# Patient Record
Sex: Male | Born: 1985 | Race: Black or African American | Hispanic: No | Marital: Single | Smoking: Current some day smoker
Health system: Southern US, Community
[De-identification: ages and names within clinical notes are randomized; demographics above are authoritative.]

## PROBLEM LIST (undated history)

## (undated) DIAGNOSIS — I10 Essential (primary) hypertension: Secondary | ICD-10-CM

## (undated) DIAGNOSIS — J45909 Unspecified asthma, uncomplicated: Secondary | ICD-10-CM

## (undated) HISTORY — DX: Essential (primary) hypertension: I10

---

## 2010-02-01 ENCOUNTER — Emergency Department (HOSPITAL_COMMUNITY): Admission: EM | Admit: 2010-02-01 | Discharge: 2010-02-01 | Payer: Self-pay | Admitting: Family Medicine

## 2013-06-26 ENCOUNTER — Encounter (HOSPITAL_COMMUNITY): Payer: Self-pay | Admitting: Emergency Medicine

## 2013-06-26 ENCOUNTER — Emergency Department (INDEPENDENT_AMBULATORY_CARE_PROVIDER_SITE_OTHER)
Admission: EM | Admit: 2013-06-26 | Discharge: 2013-06-26 | Disposition: A | Discharge status: Home / Self Care | Payer: 59 | Source: Home / Self Care

## 2013-06-26 DIAGNOSIS — M779 Enthesopathy, unspecified: Principal | ICD-10-CM

## 2013-06-26 DIAGNOSIS — L723 Sebaceous cyst: Secondary | ICD-10-CM

## 2013-06-26 DIAGNOSIS — L720 Epidermal cyst: Secondary | ICD-10-CM

## 2013-06-26 DIAGNOSIS — M65839 Other synovitis and tenosynovitis, unspecified forearm: Secondary | ICD-10-CM

## 2013-06-26 DIAGNOSIS — M65849 Other synovitis and tenosynovitis, unspecified hand: Secondary | ICD-10-CM

## 2013-06-26 DIAGNOSIS — M778 Other enthesopathies, not elsewhere classified: Secondary | ICD-10-CM

## 2013-06-26 MED ORDER — DICLOFENAC POTASSIUM 50 MG PO TABS: 50.0000 mg | ORAL_TABLET | Freq: Three times a day (TID) | ORAL | Status: DC

## 2013-06-26 NOTE — ED Notes (Signed)
Reported injury in left arm in past, seen elsewhere . States he hit his hand on something?? The other day , and since then had pain and decreased ROM on his left index finger, ?? Deformity dorsum of hand

## 2013-06-26 NOTE — ED Provider Notes (Signed)
Medical screening examination/treatment/procedure(s) were performed by resident physician or non-physician practitioner and as supervising physician I was immediately available for consultation/collaboration.   KINDL,JAMES DOUGLAS MD.   James D Kindl, MD 06/26/13 1606 

## 2013-06-26 NOTE — ED Provider Notes (Signed)
CSN: [id number]     Arrival date & time 06/26/13  1131 History   First MD Initiated Contact with Patient 06/26/13 1340     Chief Complaint  Patient presents with  . 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 The weakness involves extension of the digits. He noticed it approximately 2 days ago. There is mild swelling or puffiness to the dorsum of the hand along the junction of the 2 tendons is a approached the wrist.  The patient is also concerned about a "knot" located on the right scrotal wall. He noticed it just a few days ago. It is nontender and is not bothering him. There is been no drainage, bleeding or swelling.   History reviewed. No pertinent past medical history. History reviewed. No pertinent past surgical history. History reviewed. No pertinent family history. History  Substance Use Topics  . Smoking status: Never Smoker   . Smokeless tobacco: Not on file  . Alcohol Use: No    Review of Systems  Constitutional: Negative.   Respiratory: Negative.   Gastrointestinal: Negative.   Genitourinary: Negative.   Musculoskeletal: Negative for joint swelling.       As per HPI No known trauma  Skin:       Nodule/cyst as above.  Neurological: Negative for dizziness and weakness.    Allergies  Review of patient's allergies indicates no known allergies.  Home Medications   Current Outpatient Rx  Name  Route  Sig  Dispense  Refill  . diclofenac (CATAFLAM) 50 MG tablet   Oral   Take 1 tablet (50 mg total) by mouth 3 (three) times daily.   21 tablet   0    BP 120/78  Pulse 74  Temp(Src) 98.2 F (36.8 C) (Oral)  Resp 14  SpO2 100% Physical Exam  Nursing note and vitals reviewed. Constitutional: He is oriented to person, place, and time. He  appears well-developed and well-nourished.  HENT:  Head: Normocephalic and atraumatic.  Eyes: EOM are normal. Left eye exhibits no discharge.  Neck: Normal range of motion. Neck supple.  Genitourinary:  There is an subdermal firm spherical mass approx 4 x 4 mm in the R scrotum. Non tender, mobile, no erythema, No swelling . No testicle pain, tenderness or nodules. NEMG.  Musculoskeletal:  There is weakness with extension of the left index and long finger. He is able to extend his digits. Flexion is strong and intact on all digits. Tenderness and discomfort extends from the MCP joints to the dorsal wrist. There is also pain in the dorsum of the wrist and distal forearm with wrist extension against resistance. There is no deformity. No bony tenderness. Distal neurovascular and sensory is intact. Normal color and warmth.  Neurological: He is alert and oriented to person, place, and time. No cranial nerve deficit.  Skin: Skin is warm and dry.  Psychiatric: He has a normal mood and affect.    ED Course  Procedures (including critical care time) Labs Review Labs Reviewed - No data to display Imaging Review No results found.   MDM   1. Tendinitis of finger   2. Epidermal cyst    Wear finger splint at al time while at work for the next couple of weeks.  Ice locally. Reassurance, No tx necessary for epidermal cyst of scrotal wall.  Hayden Rasmussen, NP 06/26/13 1359  Hayden Rasmussen, NP 06/26/13 1401

## 2013-06-26 NOTE — Discharge Instructions (Signed)
Epidermal Cyst An epidermal cyst is usually a small, painless lump under the skin. Cysts often occur on the face, neck, stomach, chest, or genitals. The cyst may be filled with a bad smelling paste. Do not pop your cyst. Popping the cyst can cause pain and puffiness (swelling). HOME CARE   Only take medicines as told by your doctor.  Take your medicine (antibiotics) as told. Finish it even if you start to feel better. GET HELP RIGHT AWAY IF:  Your cyst is tender, red, or puffy.  You are not getting better, or you are getting worse.  You have any questions or concerns. MAKE SURE YOU:  Understand these instructions.  Will watch your condition.  Will get help right away if you are not doing well or get worse. Document Released: 04/19/2004 Document Revised: 09/11/2011 Document Reviewed: 09/18/2010 [facility]ExitCare Patient Information 2014 [place], [place].  Repetitive Strain Injuries Repetitive strain injuries (RSIs) result from overuse or misuse of soft tissues including muscles, tendons, or nerves. Tendons are the cord-like structures that attach muscles to bones. RSIs can affect almost any part of the body. However, RSIs are most common in the arms (thumbs, wrists, elbows, shoulders) and legs (ankles, knees). Common medical conditions that are often caused by repetitive strain include carpal tunnel syndrome, tennis or golfer's elbow, bursitis, and tendonitis. If RSIs are treated early, and therepeated activity is reduced or removed, the severity and length of your problems can usually be reduced. RSIs are also called cumulative trauma disorders (CTD).  CAUSES  Many RSIs occur due to repeating the same activity at work over weeks or months without sufficient rest, such as prolonged typing. RSIs also commonly occur when a hobby or sport is done repeatedly without sufficient rest. RSIs can also occur due to repeated strain or stress on a body part in someone who has one or more risk factors for  RSIs. RISK FACTORS Workplace risk factors  Frequent computer use, especially if your workstation is not adjusted for your body type.  Infrequent rest breaks.  Working in a high-pressure environment.  Working at a [employer]fast pace.  Repeating the same motion, such as frequent typing.  Working in an awkward position or holding the same position for a long time.  Forceful movements such as lifting, pulling, or pushing.  Vibration caused by using power tools.  Working in cold temperatures.  Job stress. Personal risk factors  Poor posture.  Being loose-jointed.  Not exercising regularly.  Being overweight.  Arthritis, diabetes, thyroid problems, or other long-term (chronic)medical conditions.  Vitamin deficiencies.  Keeping your fingernails long.  An unhealthy, stressful, or inactive lifestyle.  Not sleeping well. SYMPTOMS  Symptoms often begin at work but become more noticeable after the repeated stress has ended. For example, you may develop fatigue or soreness in your wrist while typingat work, and at night you may develop numbness and tingling in your fingers. Common symptoms include:   Burning, shooting, or aching pain, especially in the fingers, palms, wrists, forearms, or shoulders.  Tenderness.  Swelling.  Tingling, numbness, or loss of feeling.  Pain with certain activities, such as turning a doorknob or reaching above your head.  Weakness, heaviness, or loss of coordination in yourhand.  Muscle spasms or tightness. In some cases, symptoms can become so intense that it is difficult to perform everyday tasks. Symptoms that do not improve with rest may indicate a more serious condition.  DIAGNOSIS  Your caregiver may determine the type ofRSI you have based on your medical  evaluation and a description of your activities.  TREATMENT  Treatment depends on the severity and type of RSI you have. Your caregiver may recommend rest for the affected body part,  medicines, and physical or occupational therapy to reduce pain, swelling, and soreness. Discuss the activities you do repeatedly with your caregiver. Your caregiver can help you decide whether you need to change your activities. An RSI may take months or years to heal, especially if the affected body part gets insufficient rest. In some cases, such as severe carpal tunnel syndrome, surgery may be recommended. PREVENTION  Talk with your supervisor to make sure you have the proper equipment for your work station.  Maintain good posture at your desk or work station with:  Feet flat on the floor.  Knees directly over the feet, bent at a right angle.  Lower back supported by your chair or a cushion in the curve of your lower back.  Shoulders and arms relaxed and at your sides.  Neck relaxed and not bent forwards or backwards.  Your desk and computer workstation properly adjusted to your body type.  Your chair adjusted so there is no excess pressure on the back of your thighs.  The keyboard resting above your thighs. You should be able to reach the keys with your elbows at your side, bent at a right angle. Your arms should be supported on forearm rests, with your forearms parallel to the ground.  The computer mouse within easy reach.  The monitor directly in front of you, so that your eyes are aligned with the top of the screen. The screen should be about 15 to 25 inches from your eyes.  While typing, keep your wrist straight, in a neutral position. Move your entire arm when you move your mouse or when typing hard-to-reach keys.  Only use your computer as much as you need to for work. Do not use it during breaks.  Take breaks often from any repeated activity. Alternate with another task which requires you to use different muscles, or rest at least once every hour.  Change positions regularly. If you spend a lot of time sitting, get up, walk around, and stretch.  Do not hold pens or  pencils tightly when writing.  Exercise regularly.  Maintain a normal weight.  Eat a diet with plenty of vegetables, whole grains, and fruit.  Get sufficient, restful sleep. HOME CARE INSTRUCTIONS  If your caregiver prescribed medicine to help reduce swelling, take it as directed.  Only take over-the-counter or prescription medicines for pain, discomfort, or fever as directed by your caregiver.  Reduce, and if needed, stopthe activities that are causing your problems until you have no further symptoms.If your symptoms are work-related, you may need to talk to your supervisor about changing your activities.  When symptoms develop, put ice or a cold pack on the aching area.  Put ice in a plastic bag.  Place a towel between your skin and the bag.  Leave the ice on for 15-20 minutes.  If you were given a splint to keep your wrist from bending, wear it as instructed. It is important to wear the splint at night. Use the splint for as long as your caregiver recommends. SEEK MEDICAL CARE IF:  You develop new problems.  Your problems do not get better with medicine. MAKE SURE YOU:  Understand these instructions.  Will watch your condition.  Will get help right away if you are not doing well or get worse. Document Released:  03/02/2002 Document Revised: 09/11/2011 Document Reviewed: 05/03/2011 [facility] Patient Information 2014 [place], [place].  Tendinitis Tendinitis is swelling and inflammation of the tendons. Tendons are band-like tissues that connect muscle to bone. Tendinitis commonly occurs in the:   Shoulders (rotator cuff).  Heels (Achilles tendon).  Elbows (triceps tendon). CAUSES Tendinitis is usually caused by overusing the tendon, muscles, and joints involved. When the tissue surrounding a tendon (synovium) becomes inflamed, it is called tenosynovitis. Tendinitis commonly develops in people whose jobs require repetitive  motions. SYMPTOMS  Pain.  Tenderness.  Mild swelling. DIAGNOSIS Tendinitis is usually diagnosed by physical exam. Your caregiver may also order X-rays or other imaging tests. TREATMENT Your caregiver may recommend certain medicines or exercises for your treatment. HOME CARE INSTRUCTIONS   Use a sling or splint for as long as directed by your caregiver until the pain decreases.  Put ice on the injured area.  Put ice in a plastic bag.  Place a towel between your skin and the bag.  Leave the ice on for 15-20 minutes, 03-04 times a day.  Avoid using the limb while the tendon is painful. Perform gentle range of motion exercises only as directed by your caregiver. Stop exercises if pain or discomfort increase, unless directed otherwise by your caregiver.  Only take over-the-counter or prescription medicines for pain, discomfort, or fever as directed by your caregiver. SEEK MEDICAL CARE IF:   Your pain and swelling increase.  You develop new, unexplained symptoms, especially increased numbness in the hands. MAKE SURE YOU:   Understand these instructions.  Will watch your condition.  Will get help right away if you are not doing well or get worse. Document Released: 03/09/2000 Document Revised: 06/04/2011 Document Reviewed: 05/29/2010 [facility] Patient Information 2014 [place], [place].

## 2014-01-19 ENCOUNTER — Ambulatory Visit (HOSPITAL_COMMUNITY)
Admit: 2014-01-19 | Discharge: 2014-01-19 | Disposition: A | Discharge status: Home / Self Care | Payer: 59 | Source: Ambulatory Visit | Attending: Family Medicine | Admitting: Family Medicine

## 2014-01-19 ENCOUNTER — Encounter (HOSPITAL_COMMUNITY): Payer: Self-pay | Admitting: Emergency Medicine

## 2014-01-19 ENCOUNTER — Emergency Department (INDEPENDENT_AMBULATORY_CARE_PROVIDER_SITE_OTHER)
Admission: EM | Admit: 2014-01-19 | Discharge: 2014-01-19 | Disposition: A | Discharge status: Home / Self Care | Payer: 59 | Source: Home / Self Care | Attending: Family Medicine | Admitting: Family Medicine

## 2014-01-19 DIAGNOSIS — R05 Cough: Secondary | ICD-10-CM | POA: Insufficient documentation

## 2014-01-19 DIAGNOSIS — R0602 Shortness of breath: Secondary | ICD-10-CM

## 2014-01-19 DIAGNOSIS — J4521 Mild intermittent asthma with (acute) exacerbation: Secondary | ICD-10-CM

## 2014-01-19 MED ORDER — METHYLPREDNISOLONE SODIUM SUCC 125 MG IJ SOLR
INTRAMUSCULAR | Status: AC
  Filled 2014-01-19: qty 2

## 2014-01-19 MED ORDER — METHYLPREDNISOLONE SODIUM SUCC 125 MG IJ SOLR
125.0000 mg | Freq: Once | INTRAMUSCULAR | Status: AC
  Administered 2014-01-19: 125 mg via INTRAMUSCULAR

## 2014-01-19 MED ORDER — ALBUTEROL SULFATE (2.5 MG/3ML) 0.083% IN NEBU
5.0000 mg | INHALATION_SOLUTION | Freq: Once | RESPIRATORY_TRACT | Status: AC
  Administered 2014-01-19: 5 mg via RESPIRATORY_TRACT

## 2014-01-19 MED ORDER — ALBUTEROL SULFATE (2.5 MG/3ML) 0.083% IN NEBU
INHALATION_SOLUTION | RESPIRATORY_TRACT | Status: AC
  Filled 2014-01-19: qty 6

## 2014-01-19 MED ORDER — IPRATROPIUM BROMIDE 0.02 % IN SOLN
RESPIRATORY_TRACT | Status: AC
  Filled 2014-01-19: qty 2.5

## 2014-01-19 MED ORDER — ALBUTEROL SULFATE HFA 108 (90 BASE) MCG/ACT IN AERS: 1.0000 | INHALATION_SPRAY | Freq: Four times a day (QID) | RESPIRATORY_TRACT | Status: DC | PRN

## 2014-01-19 MED ORDER — IPRATROPIUM BROMIDE 0.02 % IN SOLN
0.5000 mg | Freq: Once | RESPIRATORY_TRACT | Status: AC
  Administered 2014-01-19: 0.5 mg via RESPIRATORY_TRACT

## 2014-01-19 NOTE — ED Notes (Signed)
Pt states that he had chest pain last noct. Pt states that he has been having shortness of breath at times. Pt is in no acute distress at this time.

## 2014-01-19 NOTE — ED Provider Notes (Signed)
CSN: [id number]     Arrival date & time 01/19/14  1153 History   First MD Initiated Contact with Patient 01/19/14 1222     Chief Complaint  Patient presents with  . Chest Pain  . Shortness of Breath   (Consider location/radiation/quality/duration/timing/severity/associated sxs/prior Treatment) Patient is a 28 y.o. male presenting with chest pain. The history is provided by the patient.  Chest Pain Pain location:  Epigastric Pain quality: sharp   Pain radiates to:  Does not radiate Pain radiates to the back: no   Duration:  12 hours Chronicity:  New Context: breathing   Relieved by:  None tried Worsened by:  Nothing tried Ineffective treatments:  None tried Associated symptoms: cough and shortness of breath   Associated symptoms: no fever, no lower extremity edema, no nausea, no palpitations and not vomiting   Risk factors: no smoking     History reviewed. No pertinent past medical history. History reviewed. No pertinent past surgical history. History reviewed. No pertinent family history. History  Substance Use Topics  . Smoking status: Never Smoker   . Smokeless tobacco: Not on file  . Alcohol Use: No    Review of Systems  Constitutional: Negative.  Negative for fever.  HENT: Positive for congestion, postnasal drip and rhinorrhea.   Respiratory: Positive for cough, chest tightness and shortness of breath.   Cardiovascular: Positive for chest pain. Negative for palpitations and leg swelling.  Gastrointestinal: Negative.  Negative for nausea and vomiting.    Allergies  Review of patient's allergies indicates no known allergies.  Home Medications   Prior to Admission medications   Medication Sig Start Date End Date Taking? Authorizing Provider  albuterol (PROVENTIL HFA;VENTOLIN HFA) 108 (90 BASE) MCG/ACT inhaler Inhale 1-2 puffs into the lungs every 6 (six) hours as needed for wheezing or shortness of breath. 01/19/14   Linna HoffJames D Aamina Skiff, MD  diclofenac (CATAFLAM) 50 MG  tablet Take 1 tablet (50 mg total) by mouth 3 (three) times daily. 06/26/13   Hayden Rasmussenavid Mabe, NP   BP 140/82  Pulse 74  Temp(Src) 98.7 F (37.1 C) (Oral)  Resp 20  SpO2 99% Physical Exam  Nursing note and vitals reviewed. Constitutional: He is oriented to person, place, and time. He appears well-developed and well-nourished. He appears distressed.  HENT:  Right Ear: External ear normal.  Left Ear: External ear normal.  Nose: Nose normal.  Mouth/Throat: Oropharynx is clear and moist.  Eyes: Conjunctivae are normal. Pupils are equal, round, and reactive to light.  Neck: Normal range of motion. Neck supple.  Cardiovascular: Regular rhythm, normal heart sounds and intact distal pulses.   Pulmonary/Chest: Effort normal and breath sounds normal. He has no wheezes. He has no rales. He exhibits tenderness.  Abdominal: Soft. Bowel sounds are normal. There is no tenderness.  Lymphadenopathy:    He has no cervical adenopathy.  Neurological: He is alert and oriented to person, place, and time.  Skin: Skin is warm and dry.    ED Course  Procedures (including critical care time) Labs Review Labs Reviewed - No data to display  Imaging Review Dg Chest 2 View  01/19/2014   CLINICAL DATA:  Cough 2 weeks.  Short of breath  EXAM: CHEST  2 VIEW  COMPARISON:  None.  FINDINGS: The heart size and mediastinal contours are within normal limits. Both lungs are clear. The visualized skeletal structures are unremarkable.  IMPRESSION: No active cardiopulmonary disease.   Electronically Signed   By: Marlan Palauharles  Clark M.D.  On: 01/19/2014 13:19   X-rays reviewed and report per radiologist.   MDM   1. SOB (shortness of breath)   2. Asthmatic bronchitis with exacerbation, mild intermittent        Linna HoffJames D Marigold Mom, MD 01/19/14 [phone]

## 2014-05-17 ENCOUNTER — Emergency Department (HOSPITAL_COMMUNITY)
Admission: EM | Admit: 2014-05-17 | Discharge: 2014-05-17 | Disposition: A | Discharge status: Home / Self Care | Payer: 59 | Source: Home / Self Care | Attending: Emergency Medicine | Admitting: Emergency Medicine

## 2014-05-17 ENCOUNTER — Encounter (HOSPITAL_COMMUNITY): Payer: Self-pay | Admitting: *Deleted

## 2014-05-17 DIAGNOSIS — L089 Local infection of the skin and subcutaneous tissue, unspecified: Secondary | ICD-10-CM

## 2014-05-17 DIAGNOSIS — J069 Acute upper respiratory infection, unspecified: Secondary | ICD-10-CM

## 2014-05-17 LAB — POCT RAPID STREP A: Streptococcus, Group A Screen (Direct): NEGATIVE

## 2014-05-17 NOTE — ED Notes (Signed)
3rd day of sore throat without fever/chills.he denies other URI sxs

## 2014-05-17 NOTE — ED Provider Notes (Signed)
CSN: [id number]     Arrival date & time 05/17/14  1031 History   First MD Initiated Contact with Patient 05/17/14 1145     Chief Complaint  Patient presents with  . Sore Throat   (Consider location/radiation/quality/duration/timing/severity/associated sxs/prior Treatment) HPI Comments: Pt also complains of frequent bumps on buttocks. Not painful, do not drain.   Patient is a 29 y.o. male presenting with URI. The history is provided by the patient. No language interpreter was used.  URI Presenting symptoms: congestion, cough, rhinorrhea and sore throat   Presenting symptoms: no ear pain and no fever   Severity:  Moderate Onset quality:  Gradual Duration:  3 days Timing:  Constant Progression:  Unchanged Chronicity:  New Relieved by:  Nothing Worsened by:  Nothing tried Ineffective treatments:  OTC medications Associated symptoms: no sinus pain and no wheezing     History reviewed. No pertinent past medical history. History reviewed. No pertinent past surgical history. History reviewed. No pertinent family history. History  Substance Use Topics  . Smoking status: Never Smoker   . Smokeless tobacco: Not on file  . Alcohol Use: No    Review of Systems  Constitutional: Negative for fever and chills.  HENT: Positive for congestion, postnasal drip, rhinorrhea and sore throat. Negative for ear pain.   Respiratory: Positive for cough. Negative for wheezing.   Skin: Negative for color change.       Bumps on buttocks    Allergies  Review of patient's allergies indicates no known allergies.  Home Medications   Prior to Admission medications   Medication Sig Start Date End Date Taking? Authorizing Provider  albuterol (PROVENTIL HFA;VENTOLIN HFA) 108 (90 BASE) MCG/ACT inhaler Inhale 1-2 puffs into the lungs every 6 (six) hours as needed for wheezing or shortness of breath. 01/19/14  Yes Linna Hoff, MD  diclofenac (CATAFLAM) 50 MG tablet Take 1 tablet (50 mg total) by mouth 3  (three) times daily. 06/26/13   Hayden Rasmussen, NP   BP 126/75 mmHg  Pulse 64  Temp(Src) 97.9 F (36.6 C) (Oral)  Resp 16  SpO2 97% Physical Exam  Constitutional: He appears well-developed and well-nourished. He does not appear ill. No distress.  HENT:  Right Ear: External ear normal.  Left Ear: External ear normal.  Nose: Mucosal edema and rhinorrhea present. Right sinus exhibits no maxillary sinus tenderness and no frontal sinus tenderness. Left sinus exhibits no maxillary sinus tenderness and no frontal sinus tenderness.  Mouth/Throat: Mucous membranes are normal. Oropharyngeal exudate, posterior oropharyngeal edema and posterior oropharyngeal erythema present.  B ear canals with cerumen  Cardiovascular: Normal rate and regular rhythm.   Pulmonary/Chest: Effort normal and breath sounds normal.  Lymphadenopathy:       Head (right side): No submental, no submandibular and no tonsillar adenopathy present.       Head (left side): No submental, no submandibular and no tonsillar adenopathy present.    He has no cervical adenopathy.  Skin: Skin is warm, dry and intact.  B buttocks with discolorations and scarring c/w with old healed abscesses.  Pt has 2 lumps on L buttock c/w early abscess or epidermal cyst.  No erythema, warmth, drainage or tenderness to palp.    ED Course  Procedures (including critical care time) Labs Review Labs Reviewed  POCT RAPID STREP A ([facility] CARE ONLY)    Imaging Review No results found.   MDM   1. URI (upper respiratory infection)   2. Skin infection    Supportive  care for uri. Warm compresses/baths for the skin infections vs. Epidermal cysts.     Cathlyn ParsonsAngela M Develle Sievers, NP 05/17/14 1244

## 2014-05-17 NOTE — Discharge Instructions (Signed)
Use warm compresses and/or sit in a warm bathtub (with or without salt in it) frequently to help the skin infection areas clear up. Continue to use the over the counter cold medicine to help manage your viral symptoms. Also add fresh lemon juice to your hot tea.    Upper Respiratory Infection, Adult An upper respiratory infection (URI) is also sometimes known as the common cold. The upper respiratory tract includes the nose, sinuses, throat, trachea, and bronchi. Bronchi are the airways leading to the lungs. Most people improve within 1 week, but symptoms can last up to 2 weeks. A residual cough may last even longer.  CAUSES Many different viruses can infect the tissues lining the upper respiratory tract. The tissues become irritated and inflamed and often become very moist. Mucus production is also common. A cold is contagious. You can easily spread the virus to others by oral contact. This includes kissing, sharing a glass, coughing, or sneezing. Touching your mouth or nose and then touching a surface, which is then touched by another person, can also spread the virus. SYMPTOMS  Symptoms typically develop 1 to 3 days after you come in contact with a cold virus. Symptoms vary from person to person. They may include:  Runny nose.  Sneezing.  Nasal congestion.  Sinus irritation.  Sore throat.  Loss of voice (laryngitis).  Cough.  Fatigue.  Muscle aches.  Loss of appetite.  Headache.  Low-grade fever. DIAGNOSIS  You might diagnose your own cold based on familiar symptoms, since most people get a cold 2 to 3 times a year. Your caregiver can confirm this based on your exam. Most importantly, your caregiver can check that your symptoms are not due to another disease such as strep throat, sinusitis, pneumonia, asthma, or epiglottitis. Blood tests, throat tests, and X-rays are not necessary to diagnose a common cold, but they may sometimes be helpful in excluding other more serious  diseases. Your caregiver will decide if any further tests are required. RISKS AND COMPLICATIONS  You may be at risk for a more severe case of the common cold if you smoke cigarettes, have chronic heart disease (such as heart failure) or lung disease (such as asthma), or if you have a weakened immune system. The very young and very old are also at risk for more serious infections. Bacterial sinusitis, middle ear infections, and bacterial pneumonia can complicate the common cold. The common cold can worsen asthma and chronic obstructive pulmonary disease (COPD). Sometimes, these complications can require emergency medical care and may be life-threatening. PREVENTION  The best way to protect against getting a cold is to practice good hygiene. Avoid oral or hand contact with people with cold symptoms. Wash your hands often if contact occurs. There is no clear evidence that vitamin C, vitamin E, echinacea, or exercise reduces the chance of developing a cold. However, it is always recommended to get plenty of rest and practice good nutrition. TREATMENT  Treatment is directed at relieving symptoms. There is no cure. Antibiotics are not effective, because the infection is caused by a virus, not by bacteria. Treatment may include:  Increased fluid intake. Sports drinks offer valuable electrolytes, sugars, and fluids.  Breathing heated mist or steam (vaporizer or shower).  Eating chicken soup or other clear broths, and maintaining good nutrition.  Getting plenty of rest.  Using gargles or lozenges for comfort.  Controlling fevers with ibuprofen or acetaminophen as directed by your caregiver.  Increasing usage of your inhaler if you have asthma.  Zinc gel and zinc lozenges, taken in the first 24 hours of the common cold, can shorten the duration and lessen the severity of symptoms. Pain medicines may help with fever, muscle aches, and throat pain. A variety of non-prescription medicines are available to  treat congestion and runny nose. Your caregiver can make recommendations and may suggest nasal or lung inhalers for other symptoms.  HOME CARE INSTRUCTIONS   Only take over-the-counter or prescription medicines for pain, discomfort, or fever as directed by your caregiver.  Use a warm mist humidifier or inhale steam from a shower to increase air moisture. This may keep secretions moist and make it easier to breathe.  Drink enough water and fluids to keep your urine clear or pale yellow.  Rest as needed.  Return to work when your temperature has returned to normal or as your caregiver advises. You may need to stay home longer to avoid infecting others. You can also use a face mask and careful hand washing to prevent spread of the virus. SEEK MEDICAL CARE IF:   After the first few days, you feel you are getting worse rather than better.  You need your caregiver's advice about medicines to control symptoms.  You develop chills, worsening shortness of breath, or brown or red sputum. These may be signs of pneumonia.  You develop yellow or brown nasal discharge or pain in the face, especially when you bend forward. These may be signs of sinusitis.  You develop a fever, swollen neck glands, pain with swallowing, or white areas in the back of your throat. These may be signs of strep throat. SEEK IMMEDIATE MEDICAL CARE IF:   You have a fever.  You develop severe or persistent headache, ear pain, sinus pain, or chest pain.  You develop wheezing, a prolonged cough, cough up blood, or have a change in your usual mucus (if you have chronic lung disease).  You develop sore muscles or a stiff neck. Document Released: 09/05/2000 Document Revised: 06/04/2011 Document Reviewed: 06/17/2013 [facility] Patient Information 2015 [place], [place]. This information is not intended to replace advice given to you by your health care provider. Make sure you discuss any questions you have with your health care  provider.

## 2014-05-21 LAB — CULTURE, GROUP A STREP

## 2014-05-22 ENCOUNTER — Telehealth (HOSPITAL_COMMUNITY): Payer: Self-pay | Admitting: Emergency Medicine

## 2014-05-22 MED ORDER — AMOXICILLIN 500 MG PO CAPS: 1000.0000 mg | ORAL_CAPSULE | Freq: Three times a day (TID) | ORAL | Status: DC

## 2014-05-22 NOTE — ED Notes (Signed)
I called pt. X 2 and it sound like she is saying Hello and hanging up.  Not sure if this is a recording.  Call 1.

## 2014-05-22 NOTE — ED Notes (Signed)
Her throat culture showed non-group A strep, therefore will treat with amoxicillin 500 mg, #15, one 3 times a day for 5 days. Prescription called into [facility] on [address]. Will inform patient of results.  Reuben Likesavid C Cheetara Hoge, MD 05/22/14 [phone]

## 2014-05-25 ENCOUNTER — Telehealth (HOSPITAL_COMMUNITY): Payer: Self-pay | Admitting: *Deleted

## 2014-05-25 NOTE — ED Notes (Signed)
I called pt.'s contact and left message for pt. to call.  Pt. called back.  Pt. verified x 2 and given results.  Pt. told he needs Amoxicillin for strep throat and where to pick up his Rx. Pt. instructed to finish all of medication and come back if not better.  Instructed if any contacts get the same symptoms, should get checked also.  Pt. voiced understanding. 05/25/2014

## 2015-09-24 IMAGING — CR DG CHEST 2V
2 series · 2 of 2 positions shown · non-contrast
Comparison: None.

CLINICAL DATA: Cough 2 weeks.  Short of breath

EXAM:
CHEST  2 VIEW

[w chest pa]
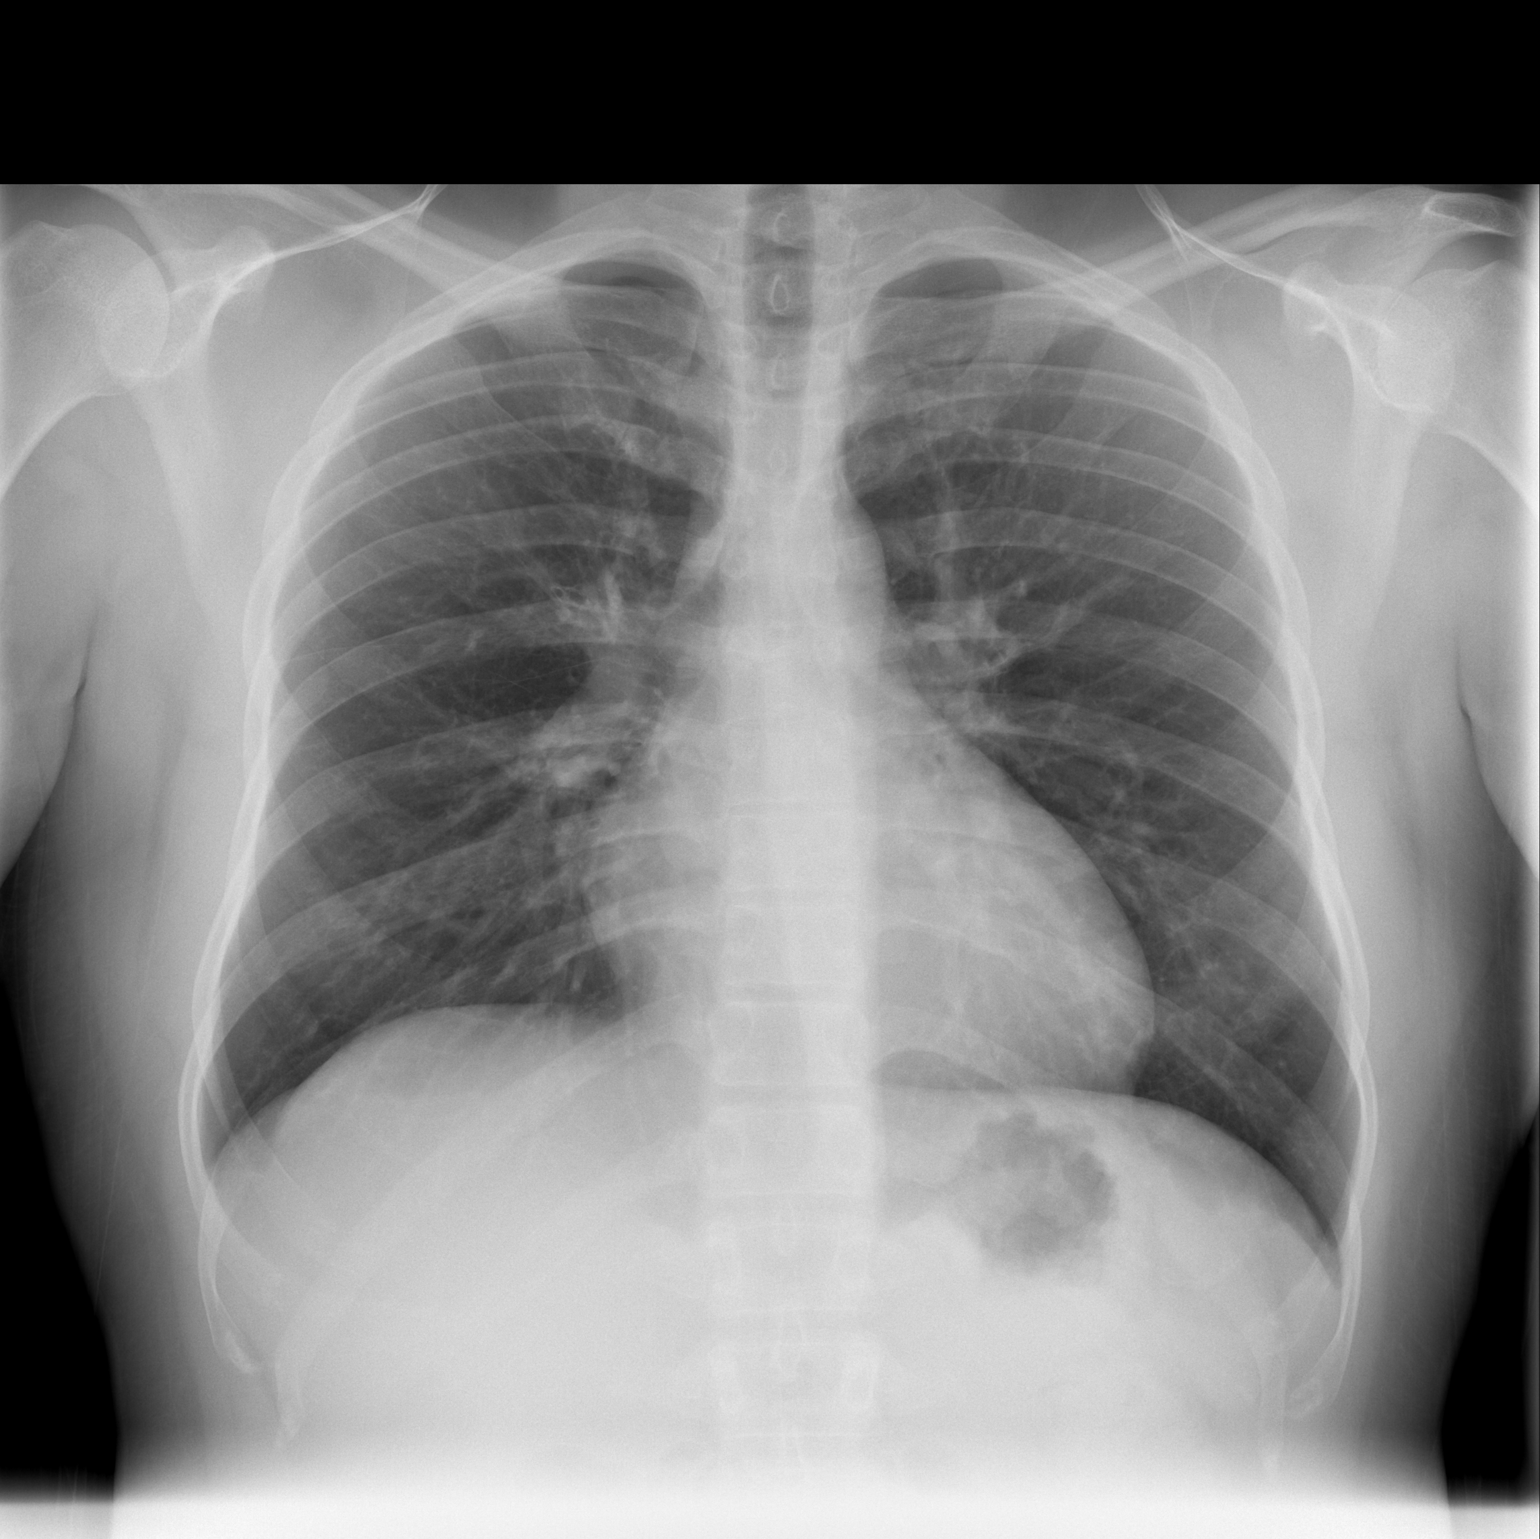

[w chest lat]
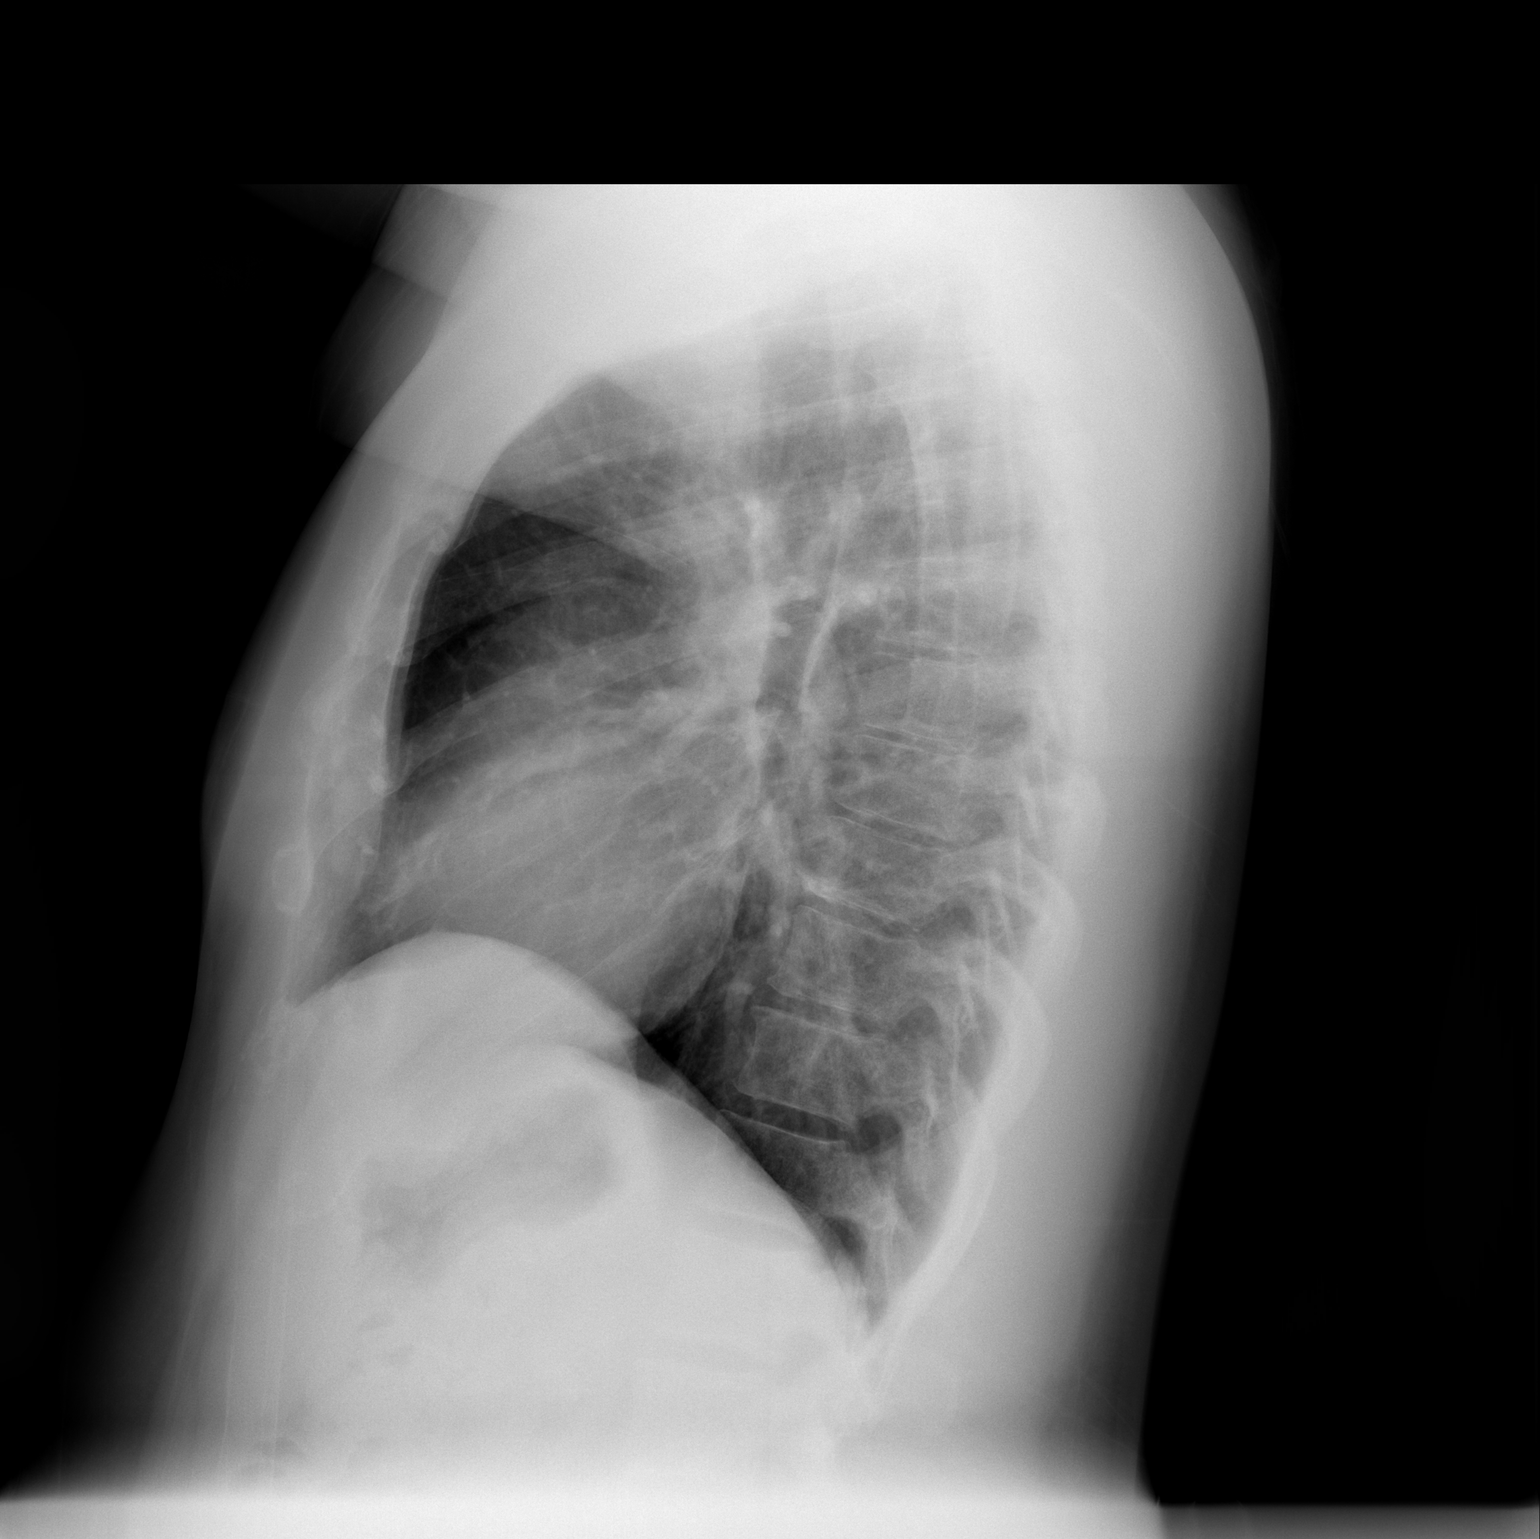

[2 of 2 positions shown; findings below may reference images not displayed]

FINDINGS: The heart size and mediastinal contours are within normal limits.
Both lungs are clear. The visualized skeletal structures are
unremarkable.
IMPRESSION: No active cardiopulmonary disease.

## 2017-10-28 ENCOUNTER — Ambulatory Visit (HOSPITAL_COMMUNITY)
Admission: EM | Admit: 2017-10-28 | Discharge: 2017-10-28 | Disposition: A | Discharge status: Home / Self Care | Payer: 59 | Attending: Family Medicine | Admitting: Family Medicine

## 2017-10-28 ENCOUNTER — Encounter (HOSPITAL_COMMUNITY): Payer: Self-pay | Admitting: Emergency Medicine

## 2017-10-28 DIAGNOSIS — L720 Epidermal cyst: Secondary | ICD-10-CM

## 2017-10-28 HISTORY — DX: Unspecified asthma, uncomplicated: J45.909

## 2017-10-28 NOTE — ED Provider Notes (Signed)
[facility]    CSN: [id number] Arrival date & time: 10/28/17  1819     History   Chief Complaint Chief Complaint  Patient presents with  . Exposure to STD    HPI Beverlyn RouxShaoni Daversa is a 32 y.o. male.   Patient not really exposed to STD but he has a scrotal cyst.  He was seen for that 3 years ago.  It may be a little bit larger.  There is no drainage or no pain associated with it he is concerned that it may be related to sexually transmitted infection but I reassured him that it is not.  HPI  Past Medical History:  Diagnosis Date  . Asthma     There are no active problems to display for this patient.   History reviewed. No pertinent surgical history.     Home Medications    Prior to Admission medications   Medication Sig Start Date End Date Taking? Authorizing Provider  albuterol (PROVENTIL HFA;VENTOLIN HFA) 108 (90 BASE) MCG/ACT inhaler Inhale 1-2 puffs into the lungs every 6 (six) hours as needed for wheezing or shortness of breath. 01/19/14   Linna HoffKindl, James D, MD  amoxicillin (AMOXIL) 500 MG capsule Take 2 capsules (1,000 mg total) by mouth 3 (three) times daily. 05/22/14   Reuben LikesKeller, David C, MD  diclofenac (CATAFLAM) 50 MG tablet Take 1 tablet (50 mg total) by mouth 3 (three) times daily. 06/26/13   Hayden RasmussenMabe, David, NP    Family History No family history on file.  Social History Social History   Tobacco Use  . Smoking status: Never Smoker  Substance Use Topics  . Alcohol use: No  . Drug use: No     Allergies   Patient has no known allergies.   Review of Systems Review of Systems  Genitourinary:       Cyst on right side of scrotum     Physical Exam Triage Vital Signs ED Triage Vitals [10/28/17 1857]  Enc Vitals Group     BP (!) 169/69     Pulse Rate (!) 111     Resp 16     Temp 98.9 F (37.2 C)     Temp Source Oral     SpO2 99 %     Weight      Height      Head Circumference      Peak Flow      Pain Score 0     Pain Loc      Pain  Edu?      Excl. in GC?    No data found.  Updated Vital Signs BP (!) 169/69   Pulse (!) 111   Temp 98.9 F (37.2 C) (Oral)   Resp 16   SpO2 99%   Visual Acuity Right Eye Distance:   Left Eye Distance:   Bilateral Distance:    Right Eye Near:   Left Eye Near:    Bilateral Near:     Physical Exam  Constitutional: He appears well-developed and well-nourished.  Genitourinary:  Genitourinary Comments: Patient has a nontender cyst on the right side of the scrotum.  It does transilluminate.  It is nontender. He was told before this is likely an epidermal cyst and I do not disagree.     UC Treatments / Results  Labs (all labs ordered are listed, but only abnormal results are displayed) Labs Reviewed - No data to display  EKG None  Radiology No results found.  Procedures Procedures (including critical  care time)  Medications Ordered in UC Medications - No data to display  Initial Impression / Assessment and Plan / UC Course  I have reviewed the triage vital signs and the nursing notes.  Pertinent labs & imaging results that were available during my care of the patient were reviewed by me and considered in my medical decision making (see chart for details).     Epidermal cyst, scrotum.  Patient desires to have this removed.  Will refer to urology Final Clinical Impressions(s) / UC Diagnoses   Final diagnoses:  None   Discharge Instructions   None    ED Prescriptions    None     Controlled Substance Prescriptions [place] Controlled Substance Registry consulted? No   Frederica Kuster, MD 10/28/17 [phone]

## 2017-10-28 NOTE — ED Triage Notes (Signed)
PT requests STD testing and is specifically concerned about a bump near groin area.

## 2018-07-08 ENCOUNTER — Other Ambulatory Visit: Payer: Self-pay

## 2018-07-08 ENCOUNTER — Encounter (HOSPITAL_COMMUNITY): Payer: Self-pay

## 2018-07-08 ENCOUNTER — Ambulatory Visit (HOSPITAL_COMMUNITY)
Admission: EM | Admit: 2018-07-08 | Discharge: 2018-07-08 | Disposition: A | Discharge status: Home / Self Care | Payer: Self-pay | Attending: Family Medicine | Admitting: Family Medicine

## 2018-07-08 DIAGNOSIS — J4520 Mild intermittent asthma, uncomplicated: Secondary | ICD-10-CM

## 2018-07-08 DIAGNOSIS — R21 Rash and other nonspecific skin eruption: Secondary | ICD-10-CM

## 2018-07-08 MED ORDER — PREDNISONE 20 MG PO TABS: 20.0000 mg | ORAL_TABLET | Freq: Two times a day (BID) | ORAL | 0 refills | Status: AC

## 2018-07-08 MED ORDER — TRIAMCINOLONE ACETONIDE 0.1 % EX CREA: 1.0000 "application " | TOPICAL_CREAM | Freq: Two times a day (BID) | CUTANEOUS | 0 refills | Status: DC

## 2018-07-08 MED ORDER — PERMETHRIN 5 % EX CREA: TOPICAL_CREAM | CUTANEOUS | 0 refills | Status: DC

## 2018-07-08 MED ORDER — ALBUTEROL SULFATE HFA 108 (90 BASE) MCG/ACT IN AERS: 1.0000 | INHALATION_SPRAY | Freq: Four times a day (QID) | RESPIRATORY_TRACT | 0 refills | Status: DC | PRN

## 2018-07-08 MED ORDER — CLOTRIMAZOLE 1 % EX CREA: TOPICAL_CREAM | CUTANEOUS | 0 refills | Status: DC

## 2018-07-08 NOTE — ED Triage Notes (Signed)
Rash began last week, on arms, trunk and legs. Pt also request inhaler.

## 2018-07-08 NOTE — Discharge Instructions (Signed)
Please apply Lotrimin cream twice daily to groin to treat jock itch/yeast  Dry Skini can also cause itching of skin- in general please use creams instead of lotions as these are more moisturizing.  May try Eucerin, CeraVe, Aquaphor; Avoid hot showers which dry skin more  Scabies could be causing your itching, please use permethrin cream; wash linens/clothes in hot water Permethrin Cream: Thoroughly massage cream (30 g for average adult) from head to soles of feet; leave on for 8 to 14 hours before removing (shower or bath); for infants and the elderly, also apply on the hairline, neck, scalp, temple, and forehead; may repeat if living mites are observed 14 days after first treatment; one application is generally curative.  It is likely symptoms may be contributing from change in soaps/ detergents- Try Aveeno and other sensitive skin products to avoid further irritation  Follow up if symptoms worsening, changing, persisting

## 2018-07-08 NOTE — ED Provider Notes (Addendum)
[facility]    CSN: [id number] Arrival date & time: 07/08/18  7829     History   Chief Complaint Chief Complaint  Patient presents with  . Rash    HPI Peter Nielsen is a 33 y.o. male history of asthma presenting today for evaluation of rash as well as refill of albuterol inhaler.  Patient states that over the past few weeks to 1 month he has had diffuse itching across his body.  He notices symptoms initially on his legs but have moved to his trunk and upper extremities as well.  He is starting to feel his symptoms on his face.  He initially thought it was related to his soaps and skin being sensitive to this and switch from using Dove to natural African soaps.  This is not helped.  He has been showering daily as well as using peroxide without relief.  Denies any other symptoms of fever, URI symptoms, nausea or vomiting.  Denies exposure to woods.  Denies any change in foods or medicines.  Denies any other changes in detergents or soaps.  Denies close contacts with similar symptoms.  Has also had itching and changes to his skin in his groin region.  He has tried using [place].  Rash mainly associated with itching, denies pain.  He also is requesting refill of albuterol inhaler.  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  Denies any wheezing, cough, fever.  Denies chest pain.  HPI  Past Medical History:  Diagnosis Date  . Asthma     There are no active problems to display for this patient.   History reviewed. No pertinent surgical history.     Home Medications    Prior to Admission medications   Medication Sig Start Date End Date Taking? Authorizing Provider  albuterol (PROVENTIL HFA;VENTOLIN HFA) 108 (90 Base) MCG/ACT inhaler Inhale 1-2 puffs into the lungs every 6 (six) hours as needed for wheezing or shortness of breath. 07/08/18   Marge Vandermeulen C, PA-C  clotrimazole (LOTRIMIN) 1 % cream  Apply to affected area 2 times daily 07/08/18   Aamani Moose, Lamont C, PA-C  permethrin (ELIMITE) 5 % cream Apply head to toe before bed, rinse off in morning after 8-12 hours 07/08/18   Nakyla Bracco C, PA-C  predniSONE (DELTASONE) 20 MG tablet Take 1 tablet (20 mg total) by mouth 2 (two) times daily with a meal for 4 days. 07/08/18 07/12/18  Shanel Prazak C, PA-C  triamcinolone cream (KENALOG) 0.1 % Apply 1 application topically 2 (two) times daily. 07/08/18   Vishnu Moeller, Junius Creamer, PA-C    Family History History reviewed. No pertinent family history.  Social History Social History   Tobacco Use  . Smoking status: Never Smoker  Substance Use Topics  . Alcohol use: No  . Drug use: No     Allergies   Patient has no known allergies.   Review of Systems Review of Systems  Constitutional: Negative for activity change, appetite change, chills, fatigue and fever.  HENT: Negative for congestion, ear pain, rhinorrhea, sinus pressure, sore throat and trouble swallowing.   Eyes: Negative for discharge, redness, itching and visual disturbance.  Respiratory: Positive for shortness of breath. Negative for cough and chest tightness.   Cardiovascular: Negative for chest pain and leg swelling.  Gastrointestinal: Negative for abdominal pain, diarrhea, nausea and vomiting.  Musculoskeletal: Negative for arthralgias and myalgias.  Skin: Positive for color change  and rash. Negative for wound.  Neurological: Negative for dizziness, syncope, weakness, light-headedness and headaches.     Physical Exam Triage Vital Signs ED Triage Vitals  Enc Vitals Group     BP 07/08/18 0835 (!) 155/82     Pulse Rate 07/08/18 0835 69     Resp 07/08/18 0835 16     Temp 07/08/18 0835 97.9 F (36.6 C)     Temp src --      SpO2 07/08/18 0835 98 %     Weight --      Height --      Head Circumference --      Peak Flow --      Pain Score 07/08/18 0839 0     Pain Loc --      Pain Edu? --      Excl. in GC? --    No  data found.  Updated Vital Signs BP (!) 155/82   Pulse 69   Temp 97.9 F (36.6 C)   Resp 16   SpO2 98%   Visual Acuity Right Eye Distance:   Left Eye Distance:   Bilateral Distance:    Right Eye Near:   Left Eye Near:    Bilateral Near:     Physical Exam Vitals signs and nursing note reviewed.  Constitutional:      Appearance: He is well-developed.     Comments: No acute distress  HENT:     Head: Normocephalic and atraumatic.     Nose: Nose normal.     Mouth/Throat:     Comments: Oral mucosa pink and moist, no tonsillar enlargement or exudate. Posterior pharynx patent and nonerythematous, no uvula deviation or swelling. Normal phonation. No lesions on oral mucosa Eyes:     Conjunctiva/sclera: Conjunctivae normal.  Neck:     Musculoskeletal: Neck supple.  Cardiovascular:     Rate and Rhythm: Normal rate.  Pulmonary:     Effort: Pulmonary effort is normal. No respiratory distress.     Comments: Breathing comfortably at rest, CTABL, no wheezing, rales or other adventitious sounds auscultated Abdominal:     General: There is no distension.  Genitourinary:    Comments: Bilateral groin areas with slight hyperpigmentation with surrounding white areas Musculoskeletal: Normal range of motion.  Skin:    General: Skin is warm and dry.     Comments: No obvious rash diffusely on skin, does have areas of hyperpigmented circular areas, no erythema or papular bumps  Neurological:     Mental Status: He is alert and oriented to person, place, and time.      UC Treatments / Results  Labs (all labs ordered are listed, but only abnormal results are displayed) Labs Reviewed - No data to display  EKG None  Radiology No results found.  Procedures Procedures (including critical care time)  Medications Ordered in UC Medications - No data to display  Initial Impression / Assessment and Plan / UC Course  I have reviewed the triage vital signs and the nursing notes.   Pertinent labs & imaging results that were available during my care of the patient were reviewed by me and considered in my medical decision making (see chart for details).     Symptoms and groin suggestive of tinea cruris/jock itch.  Will treat with Lotrimin twice daily.   Diffuse itching possibly related to dry skin versus scabies versus contact dermatitis/skin irritation from hygiene product.  Provided permethrin to apply head to toe and discussed washing linens to treat  scabies.  Will follow with course of prednisone 20 mg twice daily for 4 days, Kenalog twice daily in areas of significant itching.  Discussed general recommendations for dry skin/itching of using creams instead of lotions, avoiding hot showers; trying sensitive skin products.  Refilled albuterol inhaler, lungs clear, vital signs stable.  Continue to monitor,Discussed strict return precautions. Patient verbalized understanding and is agreeable with plan.  Final Clinical Impressions(s) / UC Diagnoses   Final diagnoses:  Rash and nonspecific skin eruption  Mild intermittent asthma without complication     Discharge Instructions     Please apply Lotrimin cream twice daily to groin to treat jock itch/yeast  Dry Skini can also cause itching of skin- in general please use creams instead of lotions as these are more moisturizing.  May try Eucerin, CeraVe, Aquaphor; Avoid hot showers which dry skin more  Scabies could be causing your itching, please use permethrin cream; wash linens/clothes in hot water Permethrin Cream: Thoroughly massage cream (30 g for average adult) from head to soles of feet; leave on for 8 to 14 hours before removing (shower or bath); for infants and the elderly, also apply on the hairline, neck, scalp, temple, and forehead; may repeat if living mites are observed 14 days after first treatment; one application is generally curative.  It is likely symptoms may be contributing from change in soaps/  detergents- Try Aveeno and other sensitive skin products to avoid further irritation  Follow up if symptoms worsening, changing, persisting     ED Prescriptions    Medication Sig Dispense Auth. Provider   albuterol (PROVENTIL HFA;VENTOLIN HFA) 108 (90 Base) MCG/ACT inhaler Inhale 1-2 puffs into the lungs every 6 (six) hours as needed for wheezing or shortness of breath. 1 Inhaler Sandy Haye C, PA-C   permethrin (ELIMITE) 5 % cream Apply head to toe before bed, rinse off in morning after 8-12 hours 60 g Bekah Igoe C, PA-C   triamcinolone cream (KENALOG) 0.1 % Apply 1 application topically 2 (two) times daily. 30 g Marieli Rudy C, PA-C   predniSONE (DELTASONE) 20 MG tablet Take 1 tablet (20 mg total) by mouth 2 (two) times daily with a meal for 4 days. 8 tablet Jarika Robben C, PA-C   clotrimazole (LOTRIMIN) 1 % cream Apply to affected area 2 times daily 15 g Isabellarose Kope C, PA-C     Controlled Substance Prescriptions [place] Controlled Substance Registry consulted? Not Applicable   Lew DawesWieters, Emmerson Taddei C, PA-C 07/08/18 0930    Lew DawesWieters, Kazimierz Springborn C, [place]PA-C 07/08/18 [phone]

## 2018-12-08 ENCOUNTER — Ambulatory Visit (INDEPENDENT_AMBULATORY_CARE_PROVIDER_SITE_OTHER): Payer: Self-pay

## 2018-12-08 ENCOUNTER — Encounter (HOSPITAL_COMMUNITY): Payer: Self-pay | Admitting: Emergency Medicine

## 2018-12-08 ENCOUNTER — Other Ambulatory Visit: Payer: Self-pay

## 2018-12-08 ENCOUNTER — Ambulatory Visit (HOSPITAL_COMMUNITY)
Admission: EM | Admit: 2018-12-08 | Discharge: 2018-12-08 | Disposition: A | Discharge status: Home / Self Care | Payer: Self-pay | Attending: Family Medicine | Admitting: Family Medicine

## 2018-12-08 DIAGNOSIS — H6123 Impacted cerumen, bilateral: Secondary | ICD-10-CM

## 2018-12-08 DIAGNOSIS — L739 Follicular disorder, unspecified: Secondary | ICD-10-CM

## 2018-12-08 DIAGNOSIS — H9191 Unspecified hearing loss, right ear: Secondary | ICD-10-CM

## 2018-12-08 DIAGNOSIS — S86912A Strain of unspecified muscle(s) and tendon(s) at lower leg level, left leg, initial encounter: Secondary | ICD-10-CM

## 2018-12-08 DIAGNOSIS — L738 Other specified follicular disorders: Secondary | ICD-10-CM

## 2018-12-08 DIAGNOSIS — H6122 Impacted cerumen, left ear: Secondary | ICD-10-CM

## 2018-12-08 MED ORDER — CARBAMIDE PEROXIDE 6.5 % OT SOLN
OTIC | Status: AC
  Filled 2018-12-08: qty 15

## 2018-12-08 MED ORDER — PREDNISONE 20 MG PO TABS: ORAL_TABLET | ORAL | 0 refills | Status: DC

## 2018-12-08 MED ORDER — DOXYCYCLINE HYCLATE 100 MG PO TABS: 100.0000 mg | ORAL_TABLET | Freq: Two times a day (BID) | ORAL | 0 refills | Status: DC

## 2018-12-08 NOTE — Discharge Instructions (Signed)
Your xray is normal

## 2018-12-08 NOTE — ED Triage Notes (Signed)
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

## 2018-12-08 NOTE — ED Provider Notes (Signed)
[facility]    CSN: [id number] Arrival date & time: 12/08/18  1155      History   Chief Complaint Chief Complaint  Patient presents with  . Knee Pain    HPI Peter Nielsen is a 33 y.o. male.   33 year old established patient at [facility] urgent care with left knee pain, pruritic diffuse body rash, and decreased hearing in the right ear.  Patient works as a Actor.  He is fallen twice in the last year and hit his left patella on the concrete surface.  Now he is having stiffness and soreness when he goes up stairs.  Patient's had a diffuse body rash diagnosis scabies.  He has small little pustules that have formed between his legs and on his torso.  He has no rash between his fingers.  Patient's had a week or 2 of right ear irritation and decreased hearing.  He is tried digging out the wax without success.     Past Medical History:  Diagnosis Date  . Asthma     There are no active problems to display for this patient.   History reviewed. No pertinent surgical history.     Home Medications    Prior to Admission medications   Medication Sig Start Date End Date Taking? Authorizing Provider  albuterol (PROVENTIL HFA;VENTOLIN HFA) 108 (90 Base) MCG/ACT inhaler Inhale 1-2 puffs into the lungs every 6 (six) hours as needed for wheezing or shortness of breath. 07/08/18   Wieters, Hallie C, PA-C  doxycycline (VIBRA-TABS) 100 MG tablet Take 1 tablet (100 mg total) by mouth 2 (two) times daily. 12/08/18   Robyn Haber, MD  predniSONE (DELTASONE) 20 MG tablet Two daily with food 12/08/18   Robyn Haber, MD    Family History History reviewed. No pertinent family history.  Social History Social History   Tobacco Use  . Smoking status: Never Smoker  Substance Use Topics  . Alcohol use: No  . Drug use: No     Allergies   Patient has no known allergies.   Review of Systems Review of Systems  HENT: Positive for hearing loss.   Musculoskeletal:  Positive for gait problem.  Skin: Positive for rash.  All other systems reviewed and are negative.    Physical Exam Triage Vital Signs ED Triage Vitals  Enc Vitals Group     BP      Pulse      Resp      Temp      Temp src      SpO2      Weight      Height      Head Circumference      Peak Flow      Pain Score      Pain Loc      Pain Edu?      Excl. in [place]?    No data found.  Updated Vital Signs BP 126/79 (BP Location: Left Arm)   Pulse 74   Temp 98.2 F (36.8 C) (Temporal)   Resp 16   SpO2 100%    Physical Exam Vitals signs and nursing note reviewed.  Constitutional:      Appearance: Normal appearance.  HENT:     Head: Normocephalic.     Right Ear: There is impacted cerumen.     Left Ear: There is impacted cerumen.     Nose: Nose normal.     Mouth/Throat:     Pharynx: Oropharynx is clear.  Eyes:  Conjunctiva/sclera: Conjunctivae normal.  Neck:     Musculoskeletal: Normal range of motion and neck supple.  Cardiovascular:     Rate and Rhythm: Normal rate and regular rhythm.  Pulmonary:     Effort: Pulmonary effort is normal.  Musculoskeletal: Normal range of motion.        General: Signs of injury present. No swelling, tenderness or deformity.     Comments: Full range of motion of the left knee.  There is no localized swelling, ecchymosis, or tenderness.  Ligamentous testing is normal.  Skin:    General: Skin is warm and dry.     Capillary Refill: Capillary refill takes less than 2 seconds.     Comments: Diffuse 2 to 3 mm pustules on chest and between legs proximally  Neurological:     General: No focal deficit present.     Mental Status: He is alert and oriented to person, place, and time.  Psychiatric:        Mood and Affect: Mood normal.    Left ear canal clear after lavage  UC Treatments / Results  Labs (all labs ordered are listed, but only abnormal results are displayed) Labs Reviewed - No data to display  EKG   Radiology Dg Knee  Ap/lat W/sunrise Left  Result Date: 12/08/2018 CLINICAL DATA:  Chronic left knee pain after multiple falls this year. EXAM: LEFT KNEE 3 VIEWS COMPARISON:  None. FINDINGS: No evidence of fracture, dislocation, or joint effusion. No evidence of arthropathy or other focal bone abnormality. Soft tissues are unremarkable. IMPRESSION: Negative. Electronically Signed   By: Lupita RaiderJames  Green Jr M.D.   On: 12/08/2018 13:44    Procedures Procedures (including critical care time)  Medications Ordered in UC Medications  carbamide peroxide (DEBROX) 6.5 % OTIC (EAR) solution (has no administration in time range)    Initial Impression / Assessment and Plan / UC Course  I have reviewed the triage vital signs and the nursing notes.  Pertinent labs & imaging results that were available during my care of the patient were reviewed by me and considered in my medical decision making (see chart for details).    Final Clinical Impressions(s) / UC Diagnoses   Final diagnoses:  Strain of left knee, initial encounter  Folliculitis  Bilateral impacted cerumen     Discharge Instructions     Your x-ray is normal.    ED Prescriptions    Medication Sig Dispense Auth. Provider   doxycycline (VIBRA-TABS) 100 MG tablet Take 1 tablet (100 mg total) by mouth 2 (two) times daily. 20 tablet Elvina SidleLauenstein, Amay Mijangos, MD   predniSONE (DELTASONE) 20 MG tablet Two daily with food 10 tablet Elvina SidleLauenstein, Ashley Montminy, MD     Controlled Substance Prescriptions [place] Controlled Substance Registry consulted? Not Applicable   Elvina SidleLauenstein, Mylen Mangan, MD 12/08/18 1348

## 2019-01-16 ENCOUNTER — Ambulatory Visit (HOSPITAL_COMMUNITY)
Admission: EM | Admit: 2019-01-16 | Discharge: 2019-01-16 | Disposition: A | Discharge status: Home / Self Care | Payer: Self-pay | Attending: Urgent Care | Admitting: Urgent Care

## 2019-01-16 ENCOUNTER — Encounter (HOSPITAL_COMMUNITY): Payer: Self-pay

## 2019-01-16 ENCOUNTER — Other Ambulatory Visit: Payer: Self-pay

## 2019-01-16 DIAGNOSIS — Z760 Encounter for issue of repeat prescription: Secondary | ICD-10-CM

## 2019-01-16 DIAGNOSIS — Z20828 Contact with and (suspected) exposure to other viral communicable diseases: Secondary | ICD-10-CM | POA: Insufficient documentation

## 2019-01-16 DIAGNOSIS — Z20822 Contact with and (suspected) exposure to covid-19: Secondary | ICD-10-CM

## 2019-01-16 DIAGNOSIS — L259 Unspecified contact dermatitis, unspecified cause: Secondary | ICD-10-CM | POA: Insufficient documentation

## 2019-01-16 MED ORDER — ALBUTEROL SULFATE HFA 108 (90 BASE) MCG/ACT IN AERS: 1.0000 | INHALATION_SPRAY | Freq: Four times a day (QID) | RESPIRATORY_TRACT | 0 refills | Status: DC | PRN

## 2019-01-16 MED ORDER — TRIAMCINOLONE ACETONIDE 0.1 % EX CREA: 1.0000 "application " | TOPICAL_CREAM | Freq: Two times a day (BID) | CUTANEOUS | 0 refills | Status: DC

## 2019-01-16 NOTE — Discharge Instructions (Addendum)
We will manage this as a viral syndrome. For sore throat or cough try using a honey-based tea. Use 3 teaspoons of honey with juice squeezed from half lemon. Place shaved pieces of ginger into 1/2-1 cup of water and warm over stove top. Then mix the ingredients and repeat every 4 hours as needed. Please take Tylenol 500mg every 6 hours. Hydrate very well with at least 2 liters of water. Eat light meals such as soups to replenish electrolytes and soft fruits, veggies. Start an antihistamine like Zyrtec, Allegra or Claritin for postnasal drainage, sinus congestion.  You can take this together with pseudoephedrine (Sudafed) at a dose of 60 mg 3 times a day oral 120 mg twice daily as needed for the same kind of congestion.   °
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
## 2019-01-18 LAB — NOVEL CORONAVIRUS, NAA (HOSP ORDER, SEND-OUT TO REF LAB; TAT 18-24 HRS): SARS-CoV-2, NAA: NOT DETECTED

## 2019-01-22 ENCOUNTER — Telehealth (HOSPITAL_COMMUNITY): Payer: Self-pay | Admitting: Emergency Medicine

## 2019-01-22 NOTE — Telephone Encounter (Signed)
Pt called and left vm asking about test results. Called and left VM to return call.

## 2019-01-22 NOTE — Telephone Encounter (Signed)
Pt returned call. Results reported as negative.

## 2022-10-07 ENCOUNTER — Other Ambulatory Visit: Payer: Self-pay

## 2022-10-07 ENCOUNTER — Emergency Department (HOSPITAL_COMMUNITY): Payer: Self-pay

## 2022-10-07 ENCOUNTER — Emergency Department (HOSPITAL_COMMUNITY)
Admission: EM | Admit: 2022-10-07 | Discharge: 2022-10-07 | Disposition: A | Discharge status: Home / Self Care | Payer: Self-pay | Attending: Emergency Medicine | Admitting: Emergency Medicine

## 2022-10-07 ENCOUNTER — Encounter (HOSPITAL_COMMUNITY): Payer: Self-pay | Admitting: *Deleted

## 2022-10-07 DIAGNOSIS — R109 Unspecified abdominal pain: Secondary | ICD-10-CM | POA: Diagnosis not present

## 2022-10-07 DIAGNOSIS — Y92410 Unspecified street and highway as the place of occurrence of the external cause: Secondary | ICD-10-CM | POA: Diagnosis not present

## 2022-10-07 DIAGNOSIS — R079 Chest pain, unspecified: Secondary | ICD-10-CM | POA: Diagnosis present

## 2022-10-07 LAB — CBC WITH DIFFERENTIAL/PLATELET
Abs Immature Granulocytes: 0.08 10*3/uL — ABNORMAL HIGH (ref 0.00–0.07)
Basophils Absolute: 0.1 10*3/uL (ref 0.0–0.1)
Basophils Relative: 1 %
Eosinophils Absolute: 0.4 10*3/uL (ref 0.0–0.5)
Eosinophils Relative: 4 %
HCT: 52.6 % — ABNORMAL HIGH (ref 39.0–52.0)
Hemoglobin: 17.7 g/dL — ABNORMAL HIGH (ref 13.0–17.0)
Immature Granulocytes: 1 %
Lymphocytes Relative: 23 %
Lymphs Abs: 2.5 10*3/uL (ref 0.7–4.0)
MCH: 31.1 pg (ref 26.0–34.0)
MCHC: 33.7 g/dL (ref 30.0–36.0)
MCV: 92.3 fL (ref 80.0–100.0)
Monocytes Absolute: 1.3 10*3/uL — ABNORMAL HIGH (ref 0.1–1.0)
Monocytes Relative: 11 %
Neutro Abs: 6.9 10*3/uL (ref 1.7–7.7)
Neutrophils Relative %: 60 %
Platelets: 193 10*3/uL (ref 150–400)
RBC: 5.7 MIL/uL (ref 4.22–5.81)
RDW: 14.3 % (ref 11.5–15.5)
WBC: 11.2 10*3/uL — ABNORMAL HIGH (ref 4.0–10.5)
nRBC: 0 % (ref 0.0–0.2)

## 2022-10-07 LAB — BASIC METABOLIC PANEL
Anion gap: 19 — ABNORMAL HIGH (ref 5–15)
BUN: 11 mg/dL (ref 6–20)
CO2: 18 mmol/L — ABNORMAL LOW (ref 22–32)
Calcium: 9.3 mg/dL (ref 8.9–10.3)
Chloride: 103 mmol/L (ref 98–111)
Creatinine, Ser: 0.99 mg/dL (ref 0.61–1.24)
GFR, Estimated: >60 mL/min (ref >60)
Glucose, Bld: 132 mg/dL — ABNORMAL HIGH (ref 70–99)
Potassium: 4.4 mmol/L (ref 3.5–5.1)
Sodium: 140 mmol/L (ref 135–145)

## 2022-10-07 MED ORDER — IOHEXOL 350 MG/ML SOLN
75.0000 mL | Freq: Once | INTRAVENOUS | Status: AC | PRN
  Administered 2022-10-07: 75 mL via INTRAVENOUS

## 2022-10-07 MED ORDER — SODIUM CHLORIDE 0.9 % IV BOLUS
1000.0000 mL | Freq: Once | INTRAVENOUS | Status: AC
  Administered 2022-10-07: 1000 mL via INTRAVENOUS

## 2022-10-07 MED ORDER — HYDROCODONE-ACETAMINOPHEN 5-325 MG PO TABS: 1.0000 | ORAL_TABLET | Freq: Four times a day (QID) | ORAL | 0 refills | Status: DC | PRN

## 2022-10-07 MED ORDER — HYDROCODONE-ACETAMINOPHEN 5-325 MG PO TABS
1.0000 | ORAL_TABLET | Freq: Once | ORAL | Status: AC
  Administered 2022-10-07: 1 via ORAL
  Filled 2022-10-07: qty 1

## 2022-10-07 MED ORDER — MORPHINE SULFATE (PF) 4 MG/ML IV SOLN
4.0000 mg | Freq: Once | INTRAVENOUS | Status: AC
  Administered 2022-10-07: 4 mg via INTRAVENOUS
  Filled 2022-10-07: qty 1
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
## 2024-01-07 ENCOUNTER — Telehealth: Payer: Self-pay | Admitting: Physician Assistant

## 2024-01-08 ENCOUNTER — Ambulatory Visit: Payer: Self-pay | Admitting: Physician Assistant

## 2024-01-08 ENCOUNTER — Encounter: Payer: Self-pay | Admitting: Physician Assistant

## 2024-01-08 ENCOUNTER — Other Ambulatory Visit (HOSPITAL_COMMUNITY): Payer: Self-pay

## 2024-01-08 VITALS — BP 174/101 | HR 76 | Wt 202.0 lb

## 2024-01-08 DIAGNOSIS — R0600 Dyspnea, unspecified: Secondary | ICD-10-CM

## 2024-01-08 DIAGNOSIS — R7303 Prediabetes: Secondary | ICD-10-CM

## 2024-01-08 DIAGNOSIS — I10 Essential (primary) hypertension: Secondary | ICD-10-CM

## 2024-01-08 DIAGNOSIS — L309 Dermatitis, unspecified: Secondary | ICD-10-CM

## 2024-01-08 DIAGNOSIS — Z13220 Encounter for screening for lipoid disorders: Secondary | ICD-10-CM

## 2024-01-08 DIAGNOSIS — R739 Hyperglycemia, unspecified: Secondary | ICD-10-CM

## 2024-01-08 MED ORDER — ALBUTEROL SULFATE HFA 108 (90 BASE) MCG/ACT IN AERS
2.0000 | INHALATION_SPRAY | Freq: Once | RESPIRATORY_TRACT | Status: AC
Start: 2024-01-08 — End: 2024-01-08
  Administered 2024-01-08: 2 via RESPIRATORY_TRACT

## 2024-01-08 MED ORDER — TRIAMCINOLONE ACETONIDE 0.1 % EX CREA
1.0000 | TOPICAL_CREAM | Freq: Two times a day (BID) | CUTANEOUS | 0 refills | Status: DC
Start: 2024-01-08 — End: 2024-01-29
  Filled 2024-01-08: qty 80, 30d supply, fill #0

## 2024-01-08 MED ORDER — AMLODIPINE BESYLATE 5 MG PO TABS
5.0000 mg | ORAL_TABLET | Freq: Every day | ORAL | 1 refills | Status: DC
Start: 2024-01-08 — End: 2024-01-29
  Filled 2024-01-08: qty 30, 30d supply, fill #0

## 2024-01-08 NOTE — Patient Instructions (Addendum)
 VISIT SUMMARY:  Today, you had a physical exam and we discussed your elevated blood pressure, prediabetes, alcohol use, asthma, and a skin rash with boils. We reviewed your current health status and made a plan to address each of these issues.  YOUR PLAN:  -ESSENTIAL HYPERTENSION: Essential hypertension means your blood pressure is higher than normal. We will start you on a low dose of medication to help lower your blood pressure. It's important to monitor your blood pressure at home.   -PREDIABETES: Prediabetes means your blood sugar levels are higher than normal but not high enough to be classified as diabetes. Your A1c is 6.0%. We discussed making changes to your diet to reduce added sugars and cutting down on alcohol. We will recheck your A1c in three months.  -ALCOHOL USE DISORDER: Alcohol use disorder means that your drinking habits could be affecting your health. You currently drink about two beers daily. We talked about the health impacts and your desire to reduce your intake.   -SKIN RASH AND RECURRENT BOILS: You have a rash and recurrent boils under your right arm, possibly due to an allergic reaction to personal care products. I will prescribe a cream for the rash and advise you to avoid products that may trigger these reactions.  How to Take Your Blood Pressure Blood pressure is a measurement of how strongly your blood is pressing against the walls of your arteries. Arteries are blood vessels that carry blood from your heart throughout your body. Your health care provider takes your blood pressure at each office visit. You can also take your own blood pressure at home with a blood pressure monitor. You may need to take your own blood pressure to: Confirm a diagnosis of high blood pressure (hypertension). Monitor your blood pressure over time. Make sure your blood pressure medicine is working. Supplies needed: Blood pressure monitor. A chair to sit in. This should be a chair where  you can sit upright with your back supported. Do not sit on a soft couch or an armchair. Table or desk. Small notebook and pencil or pen. How to prepare To get the most accurate reading, avoid the following for 30 minutes before you check your blood pressure: Drinking caffeine. Drinking alcohol. Eating. Smoking. Exercising. Five minutes before you check your blood pressure: Use the bathroom and urinate so that you have an empty bladder. Sit quietly in a chair. Do not talk. How to take your blood pressure To check your blood pressure, follow the instructions in the manual that came with your blood pressure monitor. If you have a digital blood pressure monitor, the instructions may be as follows: Sit up straight in a chair. Place your feet on the floor. Do not cross your ankles or legs. Rest your left arm at the level of your heart on a table or desk or on the arm of a chair. Pull up your shirt sleeve. Wrap the blood pressure cuff around the upper part of your left arm, 1 inch (2.5 cm) above your elbow. It is best to wrap the cuff around bare skin. Fit the cuff snugly, but not too tightly, around your arm. You should be able to place only one finger between the cuff and your arm. Position the cord so that it rests in the bend of your elbow. Press the power button. Sit quietly while the cuff inflates and deflates. Read the digital reading on the monitor screen and write the numbers down (record them) in a notebook. Wait 2-3 minutes,  then repeat the steps, starting at step 1. What does my blood pressure reading mean? A blood pressure reading consists of a higher number over a lower number. Ideally, your blood pressure should be below 120/80. The first (top) number is called the systolic pressure. It is a measure of the pressure in your arteries as your heart beats. The second (bottom) number is called the diastolic pressure. It is a measure of the pressure in your arteries as the heart  relaxes. Blood pressure is classified into four stages. The following are the stages for adults who do not have a short-term serious illness or a chronic condition. Systolic pressure and diastolic pressure are measured in a unit called mm Hg (millimeters of mercury).  Normal Systolic pressure: below 120. Diastolic pressure: below 80. Elevated Systolic pressure: 120-129. Diastolic pressure: below 80. Hypertension stage 1 Systolic pressure: 130-139. Diastolic pressure: 80-89. Hypertension stage 2 Systolic pressure: 140 or above. Diastolic pressure: 90 or above. You can have elevated blood pressure or hypertension even if only the systolic or only the diastolic number in your reading is higher than normal. Follow these instructions at home: Medicines Take over-the-counter and prescription medicines only as told by your health care provider. Tell your health care provider if you are having any side effects from blood pressure medicine. General instructions Check your blood pressure as often as recommended by your health care provider. Check your blood pressure at the same time every day. Take your monitor to the next appointment with your health care provider to make sure that: You are using it correctly. It provides accurate readings. Understand what your goal blood pressure numbers are. Keep all follow-up visits. This is important. General tips Your health care provider can suggest a reliable monitor that will meet your needs. There are several types of home blood pressure monitors. Choose a monitor that has an arm cuff. Do not choose a monitor that measures your blood pressure from your wrist or finger. Choose a cuff that wraps snugly, not too tight or too loose, around your upper arm. You should be able to fit only one finger between your arm and the cuff. You can buy a blood pressure monitor at most drugstores or online. Where to find more information American Heart Association:  www.heart.org Contact a health care provider if: Your blood pressure is consistently high. Your blood pressure is suddenly low. Get help right away if: Your systolic blood pressure is higher than 180. Your diastolic blood pressure is higher than 120. These symptoms may be an emergency. Get help right away. Call 911. Do not wait to see if the symptoms will go away. Do not drive yourself to the hospital. Summary Blood pressure is a measurement of how strongly your blood is pressing against the walls of your arteries. A blood pressure reading consists of a higher number over a lower number. Ideally, your blood pressure should be below 120/80. Check your blood pressure at the same time every day. Avoid caffeine, alcohol, smoking, and exercise for 30 minutes prior to checking your blood pressure. These agents can affect the accuracy of the blood pressure reading. This information is not intended to replace advice given to you by your health care provider. Make sure you discuss any questions you have with your health care provider. Document Revised: 11/24/2020 Document Reviewed: 11/24/2020 Elsevier Patient Education  2024 [employer].

## 2024-01-08 NOTE — Progress Notes (Unsigned)
 New Patient Office Visit  Subjective    Patient ID: Peter Nielsen, male    DOB: [birth year]  Age: 38 y.o. MRN: [id number]  CC:  Chief Complaint  Patient presents with   Annual Exam   Asthma    Rash/blister underarm   Discussed the use of AI scribe software for clinical note transcription with the patient, who gave verbal consent to proceed.  History of Present Illness   [name] is a 38 year old male who presents for a physical exam and evaluation of elevated blood pressure.  He is not on medication for blood pressure or blood sugar and cannot check his blood pressure at home. His A1c is 6.0. He is managing his diet by reducing sugar and alcohol intake. He consumes about two beers daily and is attempting to cut down on alcohol and smoking weed to improve job prospects.  He experiences occasional shortness of breath and uses an albuterol  inhaler for asthma, purchased from [facility] due to lack of insurance.  He has a rash under his right arm, with itching and boils, possibly due to allergic reactions to personal care products. The current boil started two days ago.  Tried anything for relief.  States that these are recurrent and generally well become tender and been draining.  He lives near [address] and uses public transportation. He is trying to improve his lifestyle by increasing physical activity and acknowledges the need to drink more water.    Outpatient Encounter Medications as of 01/08/2024  Medication Sig   amLODipine (NORVASC) 5 MG tablet Take 1 tablet (5 mg total) by mouth daily.   albuterol  (VENTOLIN  HFA) 108 (90 Base) MCG/ACT inhaler Inhale 1-2 puffs into the lungs every 6 (six) hours as needed for wheezing or shortness of breath.   triamcinolone  cream (KENALOG ) 0.1 % Apply topically 2 (two) times daily.   [DISCONTINUED] HYDROcodone -acetaminophen  (NORCO/VICODIN) 5-325 MG tablet Take 1 tablet by mouth every 6 (six) hours as needed for severe pain.    [DISCONTINUED] triamcinolone  cream (KENALOG ) 0.1 % Apply 1 application topically 2 (two) times daily.   [EXPIRED] albuterol  (VENTOLIN  HFA) 108 (90 Base) MCG/ACT inhaler 2 puff    No facility-administered encounter medications on file as of 01/08/2024.    Past Medical History:  Diagnosis Date   Asthma     History reviewed. No pertinent surgical history.  History reviewed. No pertinent family history.  Social History   Socioeconomic History   Marital status: Single    Spouse name: Not on file   Number of children: Not on file   Years of education: Not on file   Highest education level: Not on file  Occupational History   Not on file  Tobacco Use   Smoking status: Never   Smokeless tobacco: Never  Substance and Sexual Activity   Alcohol use: No   Drug use: Yes    Types: Marijuana   Sexual activity: Not on file  Other Topics Concern   Not on file  Social History Narrative   Not on file   Social Drivers of Health   Financial Resource Strain: Not on file  Food Insecurity: Not on file  Transportation Needs: Not on file  Physical Activity: Not on file  Stress: Not on file  Social Connections: Not on file  Intimate Partner Violence: Not on file    Review of Systems  Constitutional:  Negative for chills and fever.  HENT: Negative.    Eyes: Negative.   Respiratory:  Negative for shortness of breath.   Cardiovascular:  Negative for chest pain.  Gastrointestinal: Negative.   Genitourinary: Negative.   Musculoskeletal: Negative.   Skin:  Positive for itching and rash.  Neurological: Negative.   Endo/Heme/Allergies: Negative.   Psychiatric/Behavioral: Negative.          Objective    BP (!) 174/101 (BP Location: Left Arm, Patient Position: Sitting, Cuff Size: Normal)   Pulse 76   Wt 202 lb (91.6 kg)   SpO2 99%   BMI 32.60 kg/m   Physical Exam Vitals and nursing note reviewed.  Constitutional:      Appearance: Normal appearance.  HENT:     Head:  Normocephalic and atraumatic.     Right Ear: External ear normal.     Left Ear: External ear normal.     Nose: Nose normal.     Mouth/Throat:     Mouth: Mucous membranes are moist.     Pharynx: Oropharynx is clear.  Eyes:     Extraocular Movements: Extraocular movements intact.     Conjunctiva/sclera: Conjunctivae normal.     Pupils: Pupils are equal, round, and reactive to light.  Cardiovascular:     Rate and Rhythm: Normal rate and regular rhythm.     Pulses: Normal pulses.     Heart sounds: Normal heart sounds.  Pulmonary:     Effort: Pulmonary effort is normal.     Breath sounds: Normal breath sounds. No wheezing.  Musculoskeletal:     Cervical back: Normal range of motion and neck supple.  Skin:    General: Skin is warm and dry.     Findings: Rash present. Rash is scaling.     Comments: Scaly patches under both arms and near neckline.  Not erythematic nonpustular  Neurological:     General: No focal deficit present.     Mental Status: He is alert.  Psychiatric:        Mood and Affect: Mood normal.        Behavior: Behavior normal.        Thought Content: Thought content normal.        Judgment: Judgment normal.         Assessment & Plan:   Problem List Items Addressed This Visit   None Visit Diagnoses       Elevated random blood glucose level    -  Primary     Prediabetes         Dyspnea, unspecified type       Relevant Medications   albuterol  (VENTOLIN  HFA) 108 (90 Base) MCG/ACT inhaler 2 puff (Completed)     Essential hypertension       Relevant Medications   amLODipine (NORVASC) 5 MG tablet   Other Relevant Orders   CBC with Differential/Platelet   Comp. Metabolic Panel (12)   TSH     Screening, lipid       Relevant Orders   Lipid panel     Dermatitis       Relevant Medications   triamcinolone  cream (KENALOG ) 0.1 %       1. Essential hypertension Patient agreeable to trial amlodipine.  Patient encouraged to check blood pressure at home when  able, keep a written log and have available for all office visits.  Patient given appointment at patient care center to establish care.  Red flags given for prompt reevaluation.  Patient encouraged to return to mobile unit as needed. - CBC with Differential/Platelet - Comp. Metabolic Panel (12) -  TSH - amLODipine (NORVASC) 5 MG tablet; Take 1 tablet (5 mg total) by mouth daily.  Dispense: 30 tablet; Refill: 1  2. Dermatitis No boils present at this time, will treat for dermatitis.  Trial Kenalog .  Patient education given on supportive care - triamcinolone  cream (KENALOG ) 0.1 %; Apply topically 2 (two) times daily.  Dispense: 80 g; Refill: 0  3. Dyspnea, unspecified type Continue albuterol  as needed.  Patient encouraged to monitor use to help assess level of asthma. - albuterol  (VENTOLIN  HFA) 108 (90 Base) MCG/ACT inhaler 2 puff  4. Prediabetes A1c 6.0.  Patient education given on low sugar diet   5. Screening, lipid Fasting labs completed today - Lipid panel   I have reviewed the patient's medical history (PMH, PSH, Social History, Family History, Medications, and allergies) , and have been updated if relevant. I spent 30 minutes reviewing chart and  face to face time with patient.   Return in about 20 days (around 01/28/2024) for At Patient Care.   Kirk RAMAN Mayers, PA-C

## 2024-01-09 ENCOUNTER — Telehealth: Payer: Self-pay | Admitting: General Practice

## 2024-01-09 NOTE — Telephone Encounter (Signed)
 Contacted Pt cancel due top pcp no in the office pt can report to 4th floor room 412 to see carlos for fin assistance

## 2024-01-19 ENCOUNTER — Other Ambulatory Visit (HOSPITAL_COMMUNITY): Payer: Self-pay

## 2024-01-27 ENCOUNTER — Ambulatory Visit: Payer: Self-pay

## 2024-01-28 ENCOUNTER — Ambulatory Visit (INDEPENDENT_AMBULATORY_CARE_PROVIDER_SITE_OTHER): Payer: Self-pay | Admitting: Nurse Practitioner

## 2024-01-28 ENCOUNTER — Encounter: Payer: Self-pay | Admitting: Nurse Practitioner

## 2024-01-28 VITALS — BP 139/77 | HR 63 | Wt 205.4 lb

## 2024-01-28 DIAGNOSIS — Z130 Encounter for screening for diseases of the blood and blood-forming organs and certain disorders involving the immune mechanism: Secondary | ICD-10-CM | POA: Insufficient documentation

## 2024-01-28 DIAGNOSIS — J4540 Moderate persistent asthma, uncomplicated: Secondary | ICD-10-CM | POA: Insufficient documentation

## 2024-01-28 DIAGNOSIS — R7303 Prediabetes: Secondary | ICD-10-CM | POA: Insufficient documentation

## 2024-01-28 DIAGNOSIS — L309 Dermatitis, unspecified: Secondary | ICD-10-CM | POA: Insufficient documentation

## 2024-01-28 DIAGNOSIS — Z1329 Encounter for screening for other suspected endocrine disorder: Secondary | ICD-10-CM

## 2024-01-28 DIAGNOSIS — J4520 Mild intermittent asthma, uncomplicated: Secondary | ICD-10-CM

## 2024-01-28 DIAGNOSIS — F1290 Cannabis use, unspecified, uncomplicated: Secondary | ICD-10-CM | POA: Insufficient documentation

## 2024-01-28 DIAGNOSIS — Z1321 Encounter for screening for nutritional disorder: Secondary | ICD-10-CM

## 2024-01-28 DIAGNOSIS — F17200 Nicotine dependence, unspecified, uncomplicated: Secondary | ICD-10-CM | POA: Insufficient documentation

## 2024-01-28 DIAGNOSIS — R21 Rash and other nonspecific skin eruption: Secondary | ICD-10-CM | POA: Insufficient documentation

## 2024-01-28 DIAGNOSIS — J45909 Unspecified asthma, uncomplicated: Secondary | ICD-10-CM | POA: Insufficient documentation

## 2024-01-28 DIAGNOSIS — I10 Essential (primary) hypertension: Secondary | ICD-10-CM | POA: Insufficient documentation

## 2024-01-28 DIAGNOSIS — Z13228 Encounter for screening for other metabolic disorders: Secondary | ICD-10-CM

## 2024-01-28 NOTE — Assessment & Plan Note (Signed)
 Cessation encouraged

## 2024-01-28 NOTE — Assessment & Plan Note (Signed)
 Need to quit smoking cigarettes discussed

## 2024-01-28 NOTE — Progress Notes (Signed)
 New Patient Office Visit  Subjective:  Patient ID: Peter Nielsen, male    DOB: 12/12/1985  Age: 38 y.o. MRN: [id number]  CC:  Chief Complaint  Patient presents with   Establish Care   Skin Problem    Possible HS, getting boils, causing hyperpigmentation    Referral    Dermatology    Labs Only    Kidney, liver, cholesterol    Shoulder Pain    Right shoulder     HPI    Discussed the use of AI scribe software for clinical note transcription with the patient, who gave verbal consent to proceed.  History of Present Illness Peter Nielsen is a 38 year old male with hypertension and asthma who presents to establish care. He has no previous PCP. He was evaluated the mobile clinic on 01/08/2024 and referred her to establish care   He has a history of hypertension with recent elevated blood pressure readings of 149/86 mmHg initially and 139/77 mmHg upon recheck. He has not started on amlodipine, prescribed on January 08, 2024, as he has not picked up the prescription from the pharmacy.  He experiences skin rashes that itch and form boils, which start small, itch, and then pop, leaving black marks. He currently has a rash described as a 'little cyst' that itches and sometimes hurts. He has not yet picked up the prescribed cream for this condition.  He has a history of asthma and uses an inhaler as needed  He experiences occasional shortness of breath, especially when walking a lot, but it is not severe. No chest pain, swelling in feet, or severe shortness of breath.  Hi recent  A1c was  6.0%. He has not had further lab work done yet.  He lives with his brother and uncle, is single with no children, uses marijuana, and drinks alcohol occasionally but is trying to reduce consumption. He smokes cigarettes rarely, usually when drinking.  Assessment & Plan       Past Medical History:  Diagnosis Date   Asthma    Hypertension     Past Surgical History:  Procedure Laterality Date    WRIST SURGERY Left    2010    Family History  Problem Relation Age of Onset   Stroke Mother    Aneurysm Mother    Cancer - Lung Maternal Grandmother     Social History   Socioeconomic History   Marital status: Single    Spouse name: Not on file   Number of children: Not on file   Years of education: Not on file   Highest education level: Not on file  Occupational History   Not on file  Tobacco Use   Smoking status: Some Days    Types: Cigarettes   Smokeless tobacco: Never  Substance and Sexual Activity   Alcohol use: No   Drug use: Yes    Types: Marijuana   Sexual activity: Not Currently  Other Topics Concern   Not on file  Social History Narrative   Lives with his brother and his uncle    Social Drivers of Corporate Investment Banker Strain: Not on file  Food Insecurity: Not on file  Transportation Needs: Not on file  Physical Activity: Not on file  Stress: Not on file  Social Connections: Not on file  Intimate Partner Violence: Not on file    ROS Review of Systems  Constitutional:  Negative for appetite change, chills, fatigue and fever.  HENT:  Negative for congestion, postnasal  drip, rhinorrhea and sneezing.   Respiratory:  Negative for cough, shortness of breath and wheezing.   Cardiovascular:  Negative for chest pain, palpitations and leg swelling.  Gastrointestinal:  Negative for abdominal pain, constipation, nausea and vomiting.  Genitourinary:  Negative for difficulty urinating, dysuria, flank pain and frequency.  Musculoskeletal:  Negative for arthralgias, back pain, joint swelling and myalgias.  Skin:  Positive for rash. Negative for color change, pallor and wound.  Neurological:  Negative for dizziness, facial asymmetry, weakness, numbness and headaches.  Psychiatric/Behavioral:  Negative for behavioral problems, confusion, self-injury and suicidal ideas.     Objective:   Today's Vitals: BP 139/77 (BP Location: Left Arm, Patient Position:  Sitting, Cuff Size: Large)   Pulse 63   Wt 205 lb 6.4 oz (93.2 kg)   SpO2 99%   BMI 33.15 kg/m   Physical Exam Vitals and nursing note reviewed.  Constitutional:      General: He is not in acute distress.    Appearance: Normal appearance. He is obese. He is not ill-appearing, toxic-appearing or diaphoretic.  Eyes:     General: No scleral icterus.       Right eye: No discharge.        Left eye: No discharge.     Extraocular Movements: Extraocular movements intact.     Conjunctiva/sclera: Conjunctivae normal.  Cardiovascular:     Rate and Rhythm: Normal rate and regular rhythm.     Pulses: Normal pulses.     Heart sounds: Normal heart sounds. No murmur heard.    No friction rub. No gallop.  Pulmonary:     Effort: Pulmonary effort is normal. No respiratory distress.     Breath sounds: Normal breath sounds. No stridor. No wheezing, rhonchi or rales.  Chest:     Chest wall: No tenderness.  Abdominal:     General: There is no distension.     Palpations: Abdomen is soft.     Tenderness: There is no abdominal tenderness. There is no right CVA tenderness, left CVA tenderness or guarding.  Musculoskeletal:        General: No swelling, tenderness, deformity or signs of injury.     Right lower leg: No edema.     Left lower leg: No edema.  Skin:    General: Skin is warm and dry.     Capillary Refill: Capillary refill takes less than 2 seconds.     Coloration: Skin is not jaundiced or pale.     Findings: Rash present. No bruising, erythema or lesion.  Neurological:     Mental Status: He is alert and oriented to person, place, and time.     Motor: No weakness.     Coordination: Coordination normal.     Gait: Gait normal.  Psychiatric:        Mood and Affect: Mood normal.        Behavior: Behavior normal.        Thought Content: Thought content normal.        Judgment: Judgment normal.     Assessment & Plan:   Problem List Items Addressed This Visit       Cardiovascular and  Mediastinum   Hypertension - Primary   Encouraged to pick up prescription for amlodipine 5 mg tablet and take as ordered Monitor blood pressure at home keep a log and bring to next appointment DASH diet and commitment to daily physical activity for a minimum of 30 minutes discussed and encouraged, as a part of hypertension  management. The importance of attaining a healthy weight is also discussed.     01/28/2024   11:07 AM 01/28/2024   10:44 AM 01/08/2024   11:49 AM 10/07/2022    2:49 PM 10/07/2022   11:45 AM 10/07/2022   11:00 AM 10/07/2022   10:46 AM  BP/Weight  Systolic BP 139 149 174 140 145 138 159  Diastolic BP 77 86 101 88 95 74 87  Wt. (Lbs)  205.4 202      BMI  33.15 kg/m2 32.6 kg/m2                 Respiratory   Asthma   Continue albuterol  inhaler 2 puffs every 6 hours as needed        Musculoskeletal and Integument   Dermatitis   Rash and other nonspecific skin eruption   Dermatitis and possible hidradenitis suppurativa Rashes with itching and boils suggest possible hidradenitis suppurativa. No drainage observed, redness noted, scarring noted on both thighs . - Encourage use of prescribed Kenalog  cream for itching. - Advise reporting redness or drainage for potential antibiotic treatment. Referral to dermatology placed       Relevant Orders   Ambulatory referral to Dermatology     Other   Marijuana use   Cessation encouraged      Tobacco use disorder, mild, abuse   Need to quit smoking cigarettes discussed      Screening for endocrine, nutritional, metabolic and immunity disorder   Relevant Orders   CBC   CMP14+EGFR   TSH   Lipid panel   HIV Antibody (routine testing w rflx)   Hepatitis C antibody   Prediabetes   A1c at 6.0 indicates prediabetes. Discussed lifestyle modifications to prevent diabetes progression. - Order labs: lipid panel, thyroid function, kidney function, blood counts. - Advise avoiding sugar-sweetened beverages and exercising  30 minutes, 5 days a week. - Recommend weight loss to reduce diabetes risk.         Outpatient Encounter Medications as of 01/28/2024  Medication Sig   albuterol  (VENTOLIN  HFA) 108 (90 Base) MCG/ACT inhaler Inhale 1-2 puffs into the lungs every 6 (six) hours as needed for wheezing or shortness of breath.   triamcinolone  cream (KENALOG ) 0.1 % Apply topically 2 (two) times daily.   amLODipine (NORVASC) 5 MG tablet Take 1 tablet (5 mg total) by mouth daily. (Patient not taking: Reported on 01/28/2024)   No facility-administered encounter medications on file as of 01/28/2024.    Follow-up: Return in about 6 weeks (around 03/10/2024) for HTN.   Brookelle Pellicane R Teralyn Mullins, FNP

## 2024-01-28 NOTE — Assessment & Plan Note (Signed)
 Dermatitis and possible hidradenitis suppurativa Rashes with itching and boils suggest possible hidradenitis suppurativa. No drainage observed, redness noted, scarring noted on both thighs . - Encourage use of prescribed Kenalog  cream for itching. - Advise reporting redness or drainage for potential antibiotic treatment. Referral to dermatology placed

## 2024-01-28 NOTE — Assessment & Plan Note (Signed)
 A1c at 6.0 indicates prediabetes. Discussed lifestyle modifications to prevent diabetes progression. - Order labs: lipid panel, thyroid function, kidney function, blood counts. - Advise avoiding sugar-sweetened beverages and exercising 30 minutes, 5 days a week. - Recommend weight loss to reduce diabetes risk.

## 2024-01-28 NOTE — Assessment & Plan Note (Signed)
Continue albuterol inhaler 2 puffs every 6 hours as needed

## 2024-01-28 NOTE — Patient Instructions (Signed)
Around 3 times per week, check your blood pressure 2 times per day. once in the morning and once in the evening. The readings should be at least one minute apart. Write down these values and bring them to your next nurse visit/appointment.  When you check your BP, make sure you have been doing something calm/relaxing 5 minutes prior to checking. Both feet should be flat on the floor and you should be sitting. Use your left arm and make sure it is in a relaxed position (on a table), and that the cuff is at the approximate level/height of your heart.    It is important that you exercise regularly at least 30 minutes 5 times a week as tolerated  Think about what you will eat, plan ahead. Choose " clean, green, fresh or frozen" over canned, processed or packaged foods which are more sugary, salty and fatty. 70 to 75% of food eaten should be vegetables and fruit. Three meals at set times with snacks allowed between meals, but they must be fruit or vegetables. Aim to eat over a 12 hour period , example 7 am to 7 pm, and STOP after  your last meal of the day. Drink water,generally about 64 ounces per day, no other drink is as healthy. Fruit juice is best enjoyed in a healthy way, by EATING the fruit.  Thanks for choosing [facility] we consider it a privelige to serve you.

## 2024-01-28 NOTE — Assessment & Plan Note (Signed)
 Encouraged to pick up prescription for amlodipine 5 mg tablet and take as ordered Monitor blood pressure at home keep a log and bring to next appointment DASH diet and commitment to daily physical activity for a minimum of 30 minutes discussed and encouraged, as a part of hypertension management. The importance of attaining a healthy weight is also discussed.     01/28/2024   11:07 AM 01/28/2024   10:44 AM 01/08/2024   11:49 AM 10/07/2022    2:49 PM 10/07/2022   11:45 AM 10/07/2022   11:00 AM 10/07/2022   10:46 AM  BP/Weight  Systolic BP 139 149 174 140 145 138 159  Diastolic BP 77 86 101 88 95 74 87  Wt. (Lbs)  205.4 202      BMI  33.15 kg/m2 32.6 kg/m2

## 2024-01-29 ENCOUNTER — Ambulatory Visit: Payer: Self-pay | Admitting: Nurse Practitioner

## 2024-01-29 ENCOUNTER — Other Ambulatory Visit: Payer: Self-pay | Admitting: Nurse Practitioner

## 2024-01-29 DIAGNOSIS — I10 Essential (primary) hypertension: Secondary | ICD-10-CM

## 2024-01-29 DIAGNOSIS — L309 Dermatitis, unspecified: Secondary | ICD-10-CM

## 2024-01-29 LAB — CMP14+EGFR
ALT: 52 IU/L — ABNORMAL HIGH (ref 0–44)
AST: 35 IU/L (ref 0–40)
Albumin: 4.7 g/dL (ref 4.1–5.1)
Alkaline Phosphatase: 81 IU/L (ref 47–123)
BUN/Creatinine Ratio: 11 (ref 9–20)
BUN: 11 mg/dL (ref 6–20)
Bilirubin Total: 0.5 mg/dL (ref 0.0–1.2)
CO2: 23 mmol/L (ref 20–29)
Calcium: 9.6 mg/dL (ref 8.7–10.2)
Chloride: 102 mmol/L (ref 96–106)
Creatinine, Ser: 1.02 mg/dL (ref 0.76–1.27)
Globulin, Total: 2.8 g/dL (ref 1.5–4.5)
Glucose: 103 mg/dL — ABNORMAL HIGH (ref 70–99)
Potassium: 4.2 mmol/L (ref 3.5–5.2)
Sodium: 139 mmol/L (ref 134–144)
Total Protein: 7.5 g/dL (ref 6.0–8.5)
eGFR: 98 mL/min/1.73 (ref >59)

## 2024-01-29 LAB — CBC
Hematocrit: 53.5 % — ABNORMAL HIGH (ref 37.5–51.0)
Hemoglobin: 18.1 g/dL — ABNORMAL HIGH (ref 13.0–17.7)
MCH: 31.2 pg (ref 26.6–33.0)
MCHC: 33.8 g/dL (ref 31.5–35.7)
MCV: 92 fL (ref 79–97)
Platelets: 222 x10E3/uL (ref 150–450)
RBC: 5.81 x10E6/uL — ABNORMAL HIGH (ref 4.14–5.80)
RDW: 13.5 % (ref 11.6–15.4)
WBC: 7.7 x10E3/uL (ref 3.4–10.8)

## 2024-01-29 LAB — LIPID PANEL
Chol/HDL Ratio: 4 ratio (ref 0.0–5.0)
Cholesterol, Total: 181 mg/dL (ref 100–199)
HDL: 45 mg/dL (ref >39)
LDL Chol Calc (NIH): 120 mg/dL — ABNORMAL HIGH (ref 0–99)
Triglycerides: 88 mg/dL (ref 0–149)
VLDL Cholesterol Cal: 16 mg/dL (ref 5–40)

## 2024-01-29 LAB — TSH: TSH: 0.696 u[IU]/mL (ref 0.450–4.500)

## 2024-01-29 LAB — HEPATITIS C ANTIBODY: Hep C Virus Ab: NONREACTIVE

## 2024-01-29 LAB — HIV ANTIBODY (ROUTINE TESTING W REFLEX): HIV Screen 4th Generation wRfx: NONREACTIVE

## 2024-01-29 NOTE — Telephone Encounter (Unsigned)
 Copied from CRM [id number]. Topic: Clinical - Medication Refill >> Jan 29, 2024  9:52 AM Gustabo D wrote: Medication: amLODipine (NORVASC) 5 MG tablet triamcinolone  cream (KENALOG ) 0.1 %  Has the patient contacted their pharmacy? Yes (Agent: If no, request that the patient contact the pharmacy for the refill. If patient does not wish to contact the pharmacy document the reason why and proceed with request.) (Agent: If yes, when and what did the pharmacy advise?)  This is the patient's preferred pharmacy:  [facility] [phone] - , [place] - [address] AT [facility] OF [address]  [place] [address] Phone: [phone] Fax: [phone]     Is this the correct pharmacy for this prescription? Yes If no, delete pharmacy and type the correct one.   Has the prescription been filled recently? No  Is the patient out of the medication? No hasn't gotten it yet was suppose to be put in after last appt  Has the patient been seen for an appointment in the last year OR does the patient have an upcoming appointment? Yes  Can we respond through MyChart? No  Agent: Please be advised that Rx refills may take up to 3 business days. We ask that you follow-up with your pharmacy.

## 2024-01-30 MED ORDER — AMLODIPINE BESYLATE 5 MG PO TABS: 5.0000 mg | ORAL_TABLET | Freq: Every day | ORAL | 1 refills | Status: DC

## 2024-01-30 NOTE — Telephone Encounter (Signed)
 Please advise [facility]

## 2024-01-31 MED ORDER — TRIAMCINOLONE ACETONIDE 0.1 % EX CREA: 1.0000 | TOPICAL_CREAM | Freq: Two times a day (BID) | CUTANEOUS | 0 refills | Status: DC

## 2024-02-11 ENCOUNTER — Emergency Department (HOSPITAL_COMMUNITY)
Admission: EM | Admit: 2024-02-11 | Discharge: 2024-02-11 | Disposition: A | Discharge status: Home / Self Care | Payer: Self-pay

## 2024-02-11 ENCOUNTER — Emergency Department (HOSPITAL_COMMUNITY): Payer: Self-pay

## 2024-02-11 DIAGNOSIS — Z7952 Long term (current) use of systemic steroids: Secondary | ICD-10-CM | POA: Insufficient documentation

## 2024-02-11 DIAGNOSIS — R0789 Other chest pain: Secondary | ICD-10-CM

## 2024-02-11 DIAGNOSIS — J189 Pneumonia, unspecified organism: Secondary | ICD-10-CM | POA: Insufficient documentation

## 2024-02-11 DIAGNOSIS — J40 Bronchitis, not specified as acute or chronic: Secondary | ICD-10-CM | POA: Insufficient documentation

## 2024-02-11 DIAGNOSIS — J45901 Unspecified asthma with (acute) exacerbation: Secondary | ICD-10-CM | POA: Insufficient documentation

## 2024-02-11 LAB — CBC
HCT: 54 % — ABNORMAL HIGH (ref 39.0–52.0)
Hemoglobin: 18.3 g/dL — ABNORMAL HIGH (ref 13.0–17.0)
MCH: 30.4 pg (ref 26.0–34.0)
MCHC: 33.9 g/dL (ref 30.0–36.0)
MCV: 89.9 fL (ref 80.0–100.0)
Platelets: 220 K/uL (ref 150–400)
RBC: 6.01 MIL/uL — ABNORMAL HIGH (ref 4.22–5.81)
RDW: 14 % (ref 11.5–15.5)
WBC: 15.9 K/uL — ABNORMAL HIGH (ref 4.0–10.5)
nRBC: 0 % (ref 0.0–0.2)

## 2024-02-11 LAB — TROPONIN T, HIGH SENSITIVITY: Troponin T High Sensitivity: <15 ng/L (ref 0–19)

## 2024-02-11 LAB — BASIC METABOLIC PANEL WITH GFR
Anion gap: 16 — ABNORMAL HIGH (ref 5–15)
BUN: 10 mg/dL (ref 6–20)
CO2: 22 mmol/L (ref 22–32)
Calcium: 10 mg/dL (ref 8.9–10.3)
Chloride: 102 mmol/L (ref 98–111)
Creatinine, Ser: 0.88 mg/dL (ref 0.61–1.24)
GFR, Estimated: >60 mL/min (ref >60)
Glucose, Bld: 115 mg/dL — ABNORMAL HIGH (ref 70–99)
Potassium: 3.3 mmol/L — ABNORMAL LOW (ref 3.5–5.1)
Sodium: 140 mmol/L (ref 135–145)

## 2024-02-11 MED ORDER — AZITHROMYCIN 250 MG PO TABS
ORAL_TABLET | ORAL | Status: AC
  Administered 2024-02-11: 500 mg via ORAL
  Filled 2024-02-11: qty 2

## 2024-02-11 MED ORDER — IPRATROPIUM-ALBUTEROL 0.5-2.5 (3) MG/3ML IN SOLN
RESPIRATORY_TRACT | Status: AC
  Administered 2024-02-11: 3 mL via RESPIRATORY_TRACT
  Filled 2024-02-11: qty 3

## 2024-02-11 MED ORDER — LACTATED RINGERS IV BOLUS
1000.0000 mL | Freq: Once | INTRAVENOUS | Status: AC
  Administered 2024-02-11: 1000 mL via INTRAVENOUS

## 2024-02-11 MED ORDER — AMOXICILLIN-POT CLAVULANATE 875-125 MG PO TABS: 1.0000 | ORAL_TABLET | Freq: Once | ORAL | Status: DC

## 2024-02-11 MED ORDER — AZITHROMYCIN 250 MG PO TABS
ORAL_TABLET | ORAL | 0 refills | Status: AC
  Filled 2024-02-12: qty 6, 3d supply, fill #0

## 2024-02-11 MED ORDER — ALUM & MAG HYDROXIDE-SIMETH 200-200-20 MG/5ML PO SUSP: 30.0000 mL | Freq: Once | ORAL | Status: AC

## 2024-02-11 MED ORDER — LIDOCAINE VISCOUS HCL 2 % MT SOLN
OROMUCOSAL | Status: AC
  Administered 2024-02-11: 15 mL via ORAL
  Filled 2024-02-11: qty 15

## 2024-02-11 MED ORDER — ALUM & MAG HYDROXIDE-SIMETH 200-200-20 MG/5ML PO SUSP
ORAL | Status: AC
  Administered 2024-02-11: 30 mL via ORAL
  Filled 2024-02-11: qty 30

## 2024-02-11 MED ORDER — IPRATROPIUM-ALBUTEROL 0.5-2.5 (3) MG/3ML IN SOLN
6.0000 mL | Freq: Once | RESPIRATORY_TRACT | Status: AC
  Administered 2024-02-11: 6 mL via RESPIRATORY_TRACT
  Filled 2024-02-11: qty 3

## 2024-02-11 MED ORDER — IOHEXOL 350 MG/ML SOLN
75.0000 mL | Freq: Once | INTRAVENOUS | Status: AC | PRN
  Administered 2024-02-11: 75 mL via INTRAVENOUS

## 2024-02-11 MED ORDER — METHYLPREDNISOLONE SODIUM SUCC 125 MG IJ SOLR
125.0000 mg | Freq: Once | INTRAMUSCULAR | Status: AC
  Administered 2024-02-11: 125 mg via INTRAVENOUS
  Filled 2024-02-11: qty 2

## 2024-02-11 MED ORDER — IPRATROPIUM-ALBUTEROL 0.5-2.5 (3) MG/3ML IN SOLN: 3.0000 mL | Freq: Once | RESPIRATORY_TRACT | Status: AC

## 2024-02-11 MED ORDER — LIDOCAINE VISCOUS HCL 2 % MT SOLN: 15.0000 mL | Freq: Once | OROMUCOSAL | Status: AC

## 2024-02-11 MED ORDER — FENTANYL CITRATE (PF) 50 MCG/ML IJ SOSY
100.0000 ug | PREFILLED_SYRINGE | Freq: Once | INTRAMUSCULAR | Status: AC
  Administered 2024-02-11: 100 ug via INTRAVENOUS

## 2024-02-11 MED ORDER — FENTANYL CITRATE (PF) 50 MCG/ML IJ SOSY
PREFILLED_SYRINGE | INTRAMUSCULAR | Status: AC
  Filled 2024-02-11: qty 2

## 2024-02-11 MED ORDER — AZITHROMYCIN 250 MG PO TABS: 500.0000 mg | ORAL_TABLET | Freq: Once | ORAL | Status: AC

## 2024-02-11 MED ORDER — PREDNISONE 10 MG PO TABS
40.0000 mg | ORAL_TABLET | Freq: Every day | ORAL | 0 refills | Status: AC
Start: 2024-02-11 — End: 2024-02-16
  Filled 2024-02-12: qty 16, 4d supply, fill #0
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
## 2024-02-11 NOTE — Discharge Instructions (Addendum)
 Continue your inhaler as needed.  Take your steroids and your azithromycin as prescribed.  Return to the ER for new or worsening symptoms.

## 2024-02-11 NOTE — ED Triage Notes (Signed)
 Patient brought in by EMS from work stating he was having a asthma attack. Last 24 hour been using inhaler more. Per EMS expiratory wheezing. Duonebs given by EMS x2.  160/100 96% on RA, 102 P

## 2024-02-12 ENCOUNTER — Other Ambulatory Visit: Payer: Self-pay

## 2024-03-04 ENCOUNTER — Other Ambulatory Visit: Payer: Self-pay

## 2024-03-04 ENCOUNTER — Emergency Department (HOSPITAL_COMMUNITY)
Admission: EM | Admit: 2024-03-04 | Discharge: 2024-03-04 | Disposition: A | Discharge status: Home / Self Care | Attending: Emergency Medicine | Admitting: Emergency Medicine

## 2024-03-04 ENCOUNTER — Emergency Department (HOSPITAL_COMMUNITY)

## 2024-03-04 DIAGNOSIS — J4521 Mild intermittent asthma with (acute) exacerbation: Secondary | ICD-10-CM | POA: Diagnosis not present

## 2024-03-04 DIAGNOSIS — I10 Essential (primary) hypertension: Secondary | ICD-10-CM | POA: Diagnosis not present

## 2024-03-04 DIAGNOSIS — Z79899 Other long term (current) drug therapy: Secondary | ICD-10-CM | POA: Diagnosis not present

## 2024-03-04 DIAGNOSIS — F17200 Nicotine dependence, unspecified, uncomplicated: Secondary | ICD-10-CM | POA: Diagnosis not present

## 2024-03-04 DIAGNOSIS — R739 Hyperglycemia, unspecified: Secondary | ICD-10-CM | POA: Diagnosis not present

## 2024-03-04 DIAGNOSIS — D72829 Elevated white blood cell count, unspecified: Secondary | ICD-10-CM | POA: Insufficient documentation

## 2024-03-04 DIAGNOSIS — R0602 Shortness of breath: Secondary | ICD-10-CM | POA: Diagnosis present

## 2024-03-04 LAB — CBC WITH DIFFERENTIAL/PLATELET
Abs Immature Granulocytes: 0.07 K/uL (ref 0.00–0.07)
Basophils Absolute: 0.1 K/uL (ref 0.0–0.1)
Basophils Relative: 1 %
Eosinophils Absolute: 2.1 K/uL — ABNORMAL HIGH (ref 0.0–0.5)
Eosinophils Relative: 19 %
HCT: 53.3 % — ABNORMAL HIGH (ref 39.0–52.0)
Hemoglobin: 18.4 g/dL — ABNORMAL HIGH (ref 13.0–17.0)
Immature Granulocytes: 1 %
Lymphocytes Relative: 23 %
Lymphs Abs: 2.5 K/uL (ref 0.7–4.0)
MCH: 31 pg (ref 26.0–34.0)
MCHC: 34.5 g/dL (ref 30.0–36.0)
MCV: 89.9 fL (ref 80.0–100.0)
Monocytes Absolute: 1.3 K/uL — ABNORMAL HIGH (ref 0.1–1.0)
Monocytes Relative: 12 %
Neutro Abs: 5 K/uL (ref 1.7–7.7)
Neutrophils Relative %: 44 %
Platelets: 188 K/uL (ref 150–400)
RBC: 5.93 MIL/uL — ABNORMAL HIGH (ref 4.22–5.81)
RDW: 14 % (ref 11.5–15.5)
Smear Review: NORMAL
WBC: 11.2 K/uL — ABNORMAL HIGH (ref 4.0–10.5)
nRBC: 0 % (ref 0.0–0.2)

## 2024-03-04 LAB — BASIC METABOLIC PANEL WITH GFR
Anion gap: 13 (ref 5–15)
BUN: 7 mg/dL (ref 6–20)
CO2: 23 mmol/L (ref 22–32)
Calcium: 9.1 mg/dL (ref 8.9–10.3)
Chloride: 104 mmol/L (ref 98–111)
Creatinine, Ser: 0.85 mg/dL (ref 0.61–1.24)
GFR, Estimated: >60 mL/min (ref >60)
Glucose, Bld: 179 mg/dL — ABNORMAL HIGH (ref 70–99)
Potassium: 3.8 mmol/L (ref 3.5–5.1)
Sodium: 139 mmol/L (ref 135–145)

## 2024-03-04 LAB — RESP PANEL BY RT-PCR (RSV, FLU A&B, COVID)  RVPGX2
Influenza A by PCR: NEGATIVE
Influenza B by PCR: NEGATIVE
Resp Syncytial Virus by PCR: NEGATIVE
SARS Coronavirus 2 by RT PCR: NEGATIVE

## 2024-03-04 LAB — TROPONIN T, HIGH SENSITIVITY: Troponin T High Sensitivity: <15 ng/L (ref 0–19)

## 2024-03-04 MED ORDER — PREDNISONE 20 MG PO TABS: 40.0000 mg | ORAL_TABLET | Freq: Every day | ORAL | 0 refills | Status: AC

## 2024-03-04 MED ORDER — MAGNESIUM SULFATE 2 GM/50ML IV SOLN
2.0000 g | Freq: Once | INTRAVENOUS | Status: AC
  Administered 2024-03-04: 2 g via INTRAVENOUS
  Filled 2024-03-04: qty 50

## 2024-03-04 MED ORDER — ALBUTEROL SULFATE HFA 108 (90 BASE) MCG/ACT IN AERS: 1.0000 | INHALATION_SPRAY | Freq: Four times a day (QID) | RESPIRATORY_TRACT | 0 refills | Status: DC | PRN

## 2024-03-04 MED ORDER — ALBUTEROL SULFATE (2.5 MG/3ML) 0.083% IN NEBU
2.5000 mg | INHALATION_SOLUTION | Freq: Once | RESPIRATORY_TRACT | Status: AC
  Administered 2024-03-04: 2.5 mg via RESPIRATORY_TRACT
  Filled 2024-03-04: qty 3

## 2024-03-04 NOTE — Discharge Instructions (Signed)
 It was a pleasure taking care of you today.  As discussed, I suspect your symptoms are likely related to an asthma exacerbation.  I am sending you with steroids and an inhaler.  Take steroids for the next 5 days.  Please follow-up with PCP at the end of this week for a recheck.  Return to the ER for any new or worsening symptoms.

## 2024-03-04 NOTE — ED Notes (Signed)
 Nurse ambulated pt w pulse ox. Pt stayed consistently in the mid 90%, only dropping to 91% for a few seconds. Pt stated at 94% most of 100 ft walk.

## 2024-03-04 NOTE — ED Provider Notes (Signed)
 [place] EMERGENCY DEPARTMENT AT [facility] Provider Note   CSN: [id number] Arrival date & time: 03/04/24  9065     Patient presents with: Shortness of Breath   Peter Nielsen is a 38 y.o. male with past medical history significant for asthma, hypertension, tobacco abuse, and marijuana abuse who presents to the ED due to shortness of breath x 2 days.  Notes he has been using his albuterol  inhaler with no improvement in shortness of breath.  Admits to a productive cough with yellow phlegm.  No known fever. Admits to some chest tightness.  No previous intubations or hospitalizations for asthma.  Seen in the ED on 11/18 for asthma exacerbation where he was also found to have pneumonia and treated with azithromycin .  Patient notes he finished his antibiotics in addition to his steroids.  Breathing improved however, worsened over the past 2 to 3 days.  CTA chest at that time was negative for PE.  Denies history of blood clots, recent surgeries, recent long immobilizations, or hormonal treatments.  Denies lower extremity edema.  Patient given 10 mg of albuterol , 1 mg Atrovent , 125 mg of Solu-Medrol  by EMS prior to arrival.  History obtained from patient, EMS and past medical records. No interpreter used during encounter.       Prior to Admission medications   Medication Sig Start Date End Date Taking? Authorizing Provider  albuterol  (VENTOLIN  HFA) 108 (90 Base) MCG/ACT inhaler Inhale 1 puff into the lungs every 6 (six) hours as needed for wheezing or shortness of breath. 03/04/24  Yes Hulda Reddix, Aleck BROCKS, PA-C  predniSONE  (DELTASONE ) 20 MG tablet Take 2 tablets (40 mg total) by mouth daily for 5 days. 03/04/24 03/09/24 Yes Tarisa Paola, Aleck BROCKS, PA-C  albuterol  (VENTOLIN  HFA) 108 (90 Base) MCG/ACT inhaler Inhale 1-2 puffs into the lungs every 6 (six) hours as needed for wheezing or shortness of breath. 01/16/19   Christopher Savannah, PA-C  amLODipine  (NORVASC ) 5 MG tablet Take 1 tablet (5 mg  total) by mouth daily. 01/30/24   Paseda, Folashade R, FNP  triamcinolone  cream (KENALOG ) 0.1 % Apply topically 2 (two) times daily. 01/31/24   Paseda, Folashade R, FNP    Allergies: Patient has no known allergies.    Review of Systems  Constitutional:  Negative for fever.  Respiratory:  Positive for cough and shortness of breath.   Cardiovascular:  Positive for chest pain.    Updated Vital Signs BP (!) 161/86   Temp 98.1 F (36.7 C)   Ht 5' 6 (1.676 m)   Wt 93 kg   SpO2 94%   BMI 33.09 kg/m   Physical Exam Vitals and nursing note reviewed.  Constitutional:      General: He is not in acute distress.    Appearance: He is not ill-appearing.  HENT:     Head: Normocephalic.  Eyes:     Pupils: Pupils are equal, round, and reactive to light.  Cardiovascular:     Rate and Rhythm: Normal rate and regular rhythm.     Pulses: Normal pulses.     Heart sounds: Normal heart sounds. No murmur heard.    No friction rub. No gallop.  Pulmonary:     Effort: Pulmonary effort is normal.     Breath sounds: Wheezing present.     Comments: Decreased air movement Abdominal:     General: Abdomen is flat. There is no distension.     Palpations: Abdomen is soft.     Tenderness: There is no abdominal  tenderness. There is no guarding or rebound.  Musculoskeletal:        General: Normal range of motion.     Cervical back: Neck supple.     Comments: No lower extremity edema.   Skin:    General: Skin is warm and dry.  Neurological:     General: No focal deficit present.     Mental Status: He is alert.  Psychiatric:        Mood and Affect: Mood normal.        Behavior: Behavior normal.     (all labs ordered are listed, but only abnormal results are displayed) Labs Reviewed  CBC WITH DIFFERENTIAL/PLATELET - Abnormal; Notable for the following components:      Result Value   WBC 11.2 (*)    RBC 5.93 (*)    Hemoglobin 18.4 (*)    HCT 53.3 (*)    Monocytes Absolute 1.3 (*)     Eosinophils Absolute 2.1 (*)    All other components within normal limits  BASIC METABOLIC PANEL WITH GFR - Abnormal; Notable for the following components:   Glucose, Bld 179 (*)    All other components within normal limits  RESP PANEL BY RT-PCR (RSV, FLU A&B, COVID)  [id number]  TROPONIN T, HIGH SENSITIVITY    EKG: EKG Interpretation Date/Time:  Wednesday March 04 2024 10:15:21 EST Ventricular Rate:  100 PR Interval:  146 QRS Duration:  89 QT Interval:  329 QTC Calculation: 425 R Axis:   28  Text Interpretation: Sinus tachycardia Probable left atrial enlargement Consider anterior infarct Confirmed by Jerrol Agent ([id number]) on 03/04/2024 12:45:52 PM  Radiology: [facility] Chest Port 1 View Result Date: 03/04/2024 EXAM: 1 VIEW(S) XRAY OF THE CHEST 03/04/2024 10:12:00 AM COMPARISON: 02/11/2024 CLINICAL HISTORY: SOB FINDINGS: LUNGS AND PLEURA: No focal pulmonary opacity. No pleural effusion. No pneumothorax. HEART AND MEDIASTINUM: No acute abnormality of the cardiac and mediastinal silhouettes. BONES AND SOFT TISSUES: No acute osseous abnormality. IMPRESSION: 1. No acute process. Electronically signed by: Evalene Coho MD 03/04/2024 10:35 AM EST RP Workstation: [id number]     Procedures   Medications Ordered in the ED  magnesium  sulfate IVPB 2 g 50 mL (0 g Intravenous Stopped 03/04/24 1109)  albuterol  (PROVENTIL ) (2.5 MG/3ML) 0.083% nebulizer solution 2.5 mg (2.5 mg Nebulization Given 03/04/24 1108)    Clinical Course as of 03/04/24 1246  Wed Mar 04, 2024  1015 Reassessed patient.  O2 saturation at 98% on room air.  Still with significant wheezing and decreased air movement. [CA]  1105 Reassessed patient after magnesium .  Awaiting continuous albuterol  treatment. Resting comfortably in bed.  [CA]  1225 Reassessed patient.  Improvement in air movement.  Very mild expiratory wheeze.  Patient notes he feels much better after continuous albuterol  treatment. [CA]    Clinical Course User  Index [CA] Lorelle Aleck BROCKS, PA-C                                 Medical Decision Making Amount and/or Complexity of Data Reviewed Independent Historian: EMS Labs: ordered. Decision-making details documented in ED Course. Radiology: ordered and independent interpretation performed. Decision-making details documented in ED Course. ECG/medicine tests: ordered and independent interpretation performed. Decision-making details documented in ED Course.  Risk Prescription drug management.   This patient presents to the ED for concern of SOB, this involves an extensive number of treatment options, and is a complaint that carries with it a  high risk of complications and morbidity.  The differential diagnosis includes asthma, PNA, PTX, PE, ACS, etc  38 year old male presents to the ED due to shortness of breath and productive cough x 2 days.  History of asthma.  Seen in November for same complaint and diagnosed with atypical pneumonia where he finished azithromycin .  Has been using his albuterol  inhaler with no relief in shortness of breath.  No history of blood clots.  Upon arrival patient getting DuoNeb treatment from EMS.  Prior to arrival patient received 10 mg of albuterol , 1 mg Atrovent , 125 Solu-Medrol  and DuoNeb treatment.  On exam decreased air movement with expiratory wheeze.  Added magnesium . CAT.  Routine labs.  Chest x-ray to rule out evidence of pneumonia.  Lower suspicion for PE.  CTA chest negative for PE in November.  If patient does not improve after CAT, will obtain CTA chest to rule out PE; however suspicion is lower. Discussed with Dr. Jerrol who agrees with assessment and plan.   CBC significant for leukocytosis at 11.9.  Elevated hemoglobin 18.4.  Hemoconcentration?  Chest x-ray personally reviewed and interpreted which is negative for signs of pneumonia, pneumothorax, widened mediastinum.  RVP negative.  EKG sinus tachycardia.  No signs of ischemia. Troponin normal. Low  suspicion for ACS. BMP with hyperglycemia 179.  No anion gap.  Low suspicion for DKA.  Normal renal function.  No major electrolyte derangements.  Upon reassessment, patient has significant improvement after magnesium  and continuous albuterol  treatment.  Suspect symptoms likely related to asthma exacerbation.  Low suspicion for PE/DVT. Patient able to ambulate and maintain O2 saturation above 90% without difficulty. No episodes of desaturation while here in the ED. patient discharged with albuterol  and prednisone .  Advised patient to get rechecked by PCP at the end of this week or return for worsening symptoms.  No signs of respiratory distress.  Patient had significant improvement after continuous albuterol  treatment, magnesium , Solu-Medrol , and Duoneb treatment. Patient stable for discharge. Strict ED precautions discussed with patient. Patient states understanding and agrees to plan. Patient discharged home in no acute distress and stable vitals  Co morbidities that complicate the patient evaluation  asthma Cardiac Monitoring: / EKG:  The patient was maintained on a cardiac monitor.  I personally viewed and interpreted the cardiac monitored which showed an underlying rhythm of: sinus tachycardia  Social Determinants of Health:  Tobacco abuse  Test / Admission - Considered:  Considered admission however, patient had significant improvement after continuous albuterol  treatment.  No episodes of desaturation.  Patient stable for outpatient treatment of asthma exacerbation.      Final diagnoses:  Mild intermittent asthma with exacerbation    ED Discharge Orders          Ordered    albuterol  (VENTOLIN  HFA) 108 (90 Base) MCG/ACT inhaler  Every 6 hours PRN        03/04/24 1237    predniSONE  (DELTASONE ) 20 MG tablet  Daily        03/04/24 1237               Lorelle Aleck BROCKS, PA-C 03/04/24 1247    Jerrol Agent, MD 03/05/24 [phone]

## 2024-03-04 NOTE — ED Triage Notes (Signed)
 Pt BIB EMS from home c/o SOB for past 2/3 days. Hx of asthma and HTN . Productive cough w yellow mucus. Albuterol  at home has not improved.   EMS gave 10mg  albuterol  and 1 mg Atrovent , 125 solumedrol. 22 L ac.   EMS vitals  BP 162.100 HR 98 SPO2 100 on duoneb (fire dept had him on O2 at home)

## 2024-03-10 ENCOUNTER — Encounter: Payer: Self-pay | Admitting: Nurse Practitioner

## 2024-03-10 ENCOUNTER — Ambulatory Visit: Payer: Self-pay | Admitting: Nurse Practitioner

## 2024-03-10 VITALS — BP 136/67 | HR 64 | Wt 200.4 lb

## 2024-03-10 DIAGNOSIS — J309 Allergic rhinitis, unspecified: Secondary | ICD-10-CM | POA: Insufficient documentation

## 2024-03-10 DIAGNOSIS — F1290 Cannabis use, unspecified, uncomplicated: Secondary | ICD-10-CM

## 2024-03-10 DIAGNOSIS — R7303 Prediabetes: Secondary | ICD-10-CM

## 2024-03-10 DIAGNOSIS — I10 Essential (primary) hypertension: Secondary | ICD-10-CM

## 2024-03-10 DIAGNOSIS — J4520 Mild intermittent asthma, uncomplicated: Secondary | ICD-10-CM

## 2024-03-10 DIAGNOSIS — Z0000 Encounter for general adult medical examination without abnormal findings: Secondary | ICD-10-CM | POA: Insufficient documentation

## 2024-03-10 LAB — POCT GLYCOSYLATED HEMOGLOBIN (HGB A1C): Hemoglobin A1C: 6 % — AB (ref 4.0–5.6)

## 2024-03-10 MED ORDER — AMLODIPINE BESYLATE 5 MG PO TABS: 5.0000 mg | ORAL_TABLET | Freq: Every day | ORAL | 1 refills | Status: AC

## 2024-03-10 MED ORDER — MONTELUKAST SODIUM 10 MG PO TABS: 10.0000 mg | ORAL_TABLET | Freq: Every day | ORAL | 3 refills | Status: DC

## 2024-03-10 MED ORDER — BUDESONIDE-FORMOTEROL FUMARATE 80-4.5 MCG/ACT IN AERO: 2.0000 | INHALATION_SPRAY | Freq: Two times a day (BID) | RESPIRATORY_TRACT | 3 refills | Status: DC

## 2024-03-10 MED ORDER — ALBUTEROL SULFATE HFA 108 (90 BASE) MCG/ACT IN AERS: 2.0000 | INHALATION_SPRAY | Freq: Four times a day (QID) | RESPIRATORY_TRACT | 3 refills | Status: AC | PRN

## 2024-03-10 NOTE — Assessment & Plan Note (Signed)
 BP Readings from Last 3 Encounters:  03/10/24 136/67  03/04/24 (!) 161/86  02/11/24 (!) 167/99   HTN Controlled .  On amlodipine  5 mg daily Continue current medications. No changes in management. Discussed DASH diet and dietary sodium restrictions Continue to increase dietary efforts and exercise.

## 2024-03-10 NOTE — Assessment & Plan Note (Signed)
 Allergic rhinitis Symptoms of congestion and runny nose indicate allergic rhinitis. - Prescribed Singulair  10 mg at bedtime. Avoid allergens

## 2024-03-10 NOTE — Assessment & Plan Note (Addendum)
 Need to quit smoking marijuana discussed

## 2024-03-10 NOTE — Assessment & Plan Note (Signed)
 Essential hypertension Blood pressure controlled at 136/67 mmHg with amlodipine . Emphasized medication adherence to prevent stroke and myocardial infarction. - Continue amlodipine  5 mg daily. - Sent refill for amlodipine  to pharmacy. - Advised to pick up medication before running out.  Intermittent asthma exacerbated by environmental triggers. Discussed Symbicort  for control and Singulair  for allergies. Encouraged trigger avoidance and mask use. - Prescribed Symbicort  inhaler, two puffs into both lungs twice daily.  Advised to rinse mouth after using the inhaler - Prescribed Singulair  10 mg at bedtime. - Sent refills for albuterol  inhaler. - Advised to avoid asthma triggers and use a mask when necessary. Continue prednisone  ordered at the emergency department

## 2024-03-10 NOTE — Progress Notes (Signed)
 Established Patient Office Visit  Subjective:  Patient ID: Peter Nielsen, male    DOB: [birth year]  Age: 38 y.o. MRN: [id number]  CC:  Chief Complaint  Patient presents with   6 week follow up   Hypertension   Nasal Congestion    Taking prednisone  20m mg oral sup that was given in the ED for shortness of breath and congestion     Medication Refill    Albuterol      HPI  Discussed the use of AI scribe software for clinical note transcription with the patient, who gave verbal consent to proceed.  History of Present Illness Peter Nielsen is a 38 year old male with hypertension and asthma who presents for a follow-up on blood pressure management.  He is managing his blood pressure with amlodipine  5 mg daily, although he occasionally forgets to take it with food. His recent blood pressure reading is 136/67 mmHg. He continues to use [facility] for medication refills.  He has a history of asthma and uses an albuterol  inhaler as needed, typically two puffs every six hours when experiencing shortness of breath. 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 He has visited the emergency room twice since his last visit due to asthma exacerbations. He recently been approved for Medicaid.  No current allergy symptoms such as sneezing or runny nose, but he has been experiencing congestion and mucus production for the past few days. He prefers liquid medications over pills.  He works in [employer], does not drive, and often walks home from work. He has quit smoking cigarettes and is in the process of reducing marijuana use, acknowledging its impact on his blood pressure. 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 He has been in [place]  since 1997.   Assessment & Plan        Past Medical History:  Diagnosis Date   Asthma    Hypertension     Past Surgical History:  Procedure Laterality  Date   WRIST SURGERY Left    2010    Family History  Problem Relation Age of Onset   Stroke Mother    Aneurysm Mother    Cancer - Lung Maternal Grandmother     Social History   Socioeconomic History   Marital status: Single    Spouse name: Not on file   Number of children: Not on file   Years of education: Not on file   Highest education level: Not on file  Occupational History   Not on file  Tobacco Use   Smoking status: Some Days    Types: Cigarettes   Smokeless tobacco: Never  Substance and Sexual Activity   Alcohol use: No   Drug use: Yes    Types: Marijuana   Sexual activity: Not Currently  Other Topics Concern   Not on file  Social History Narrative   Lives with his brother and his uncle    Social Drivers of Health   Tobacco Use: High Risk (03/10/2024)   Patient History    Smoking Tobacco Use: Some Days    Smokeless Tobacco Use: Never    Passive Exposure: Not on file  Financial Resource Strain: Not on file  Food Insecurity: Not on file  Transportation Needs: Not on file  Physical Activity: Not on file  Stress: Not on file  Social Connections: Not on file  Intimate Partner Violence: Not on file  Depression (PHQ2-9): Medium Risk (01/28/2024)   Depression (PHQ2-9)    PHQ-2 Score: 6  Alcohol Screen: Not on file  Housing: Not on file  Utilities: Not on file  Health Literacy: Not on file    Outpatient Medications Prior to Visit  Medication Sig Dispense Refill   triamcinolone  cream (KENALOG ) 0.1 % Apply topically 2 (two) times daily. 80 g 0   albuterol  (VENTOLIN  HFA) 108 (90 Base) MCG/ACT inhaler Inhale 1-2 puffs into the lungs every 6 (six) hours as needed for wheezing or shortness of breath. 18 g 0   albuterol  (VENTOLIN  HFA) 108 (90 Base) MCG/ACT inhaler Inhale 1 puff into the lungs every 6 (six) hours as needed for wheezing or shortness of breath. 18 g 0   amLODipine  (NORVASC ) 5 MG tablet Take 1 tablet (5 mg total) by mouth daily. 30 tablet 1   No  facility-administered medications prior to visit.    Allergies[1]  ROS Review of Systems  Constitutional:  Negative for appetite change, chills, fatigue and fever.  HENT:  Positive for congestion, rhinorrhea and sneezing. Negative for postnasal drip.   Respiratory:  Positive for cough and wheezing. Negative for shortness of breath.   Cardiovascular:  Negative for chest pain, palpitations and leg swelling.  Gastrointestinal:  Negative for abdominal pain, constipation, nausea and vomiting.  Genitourinary:  Negative for difficulty urinating, dysuria, flank pain and frequency.  Musculoskeletal:  Negative for arthralgias, back pain, joint swelling and myalgias.  Skin:  Negative for color change, pallor, rash and wound.  Neurological:  Negative for dizziness, facial asymmetry, weakness, numbness and headaches.  Psychiatric/Behavioral:  Negative for behavioral problems, confusion, self-injury and suicidal ideas.       Objective:    Physical Exam Vitals and nursing note reviewed.  Constitutional:      General: He is not in acute distress.    Appearance: Normal appearance. He is obese. He is not ill-appearing, toxic-appearing or diaphoretic.  HENT:     Nose: Congestion and rhinorrhea present.     Mouth/Throat:     Mouth: Mucous membranes are moist.     Pharynx: Oropharynx is clear. No oropharyngeal exudate or posterior oropharyngeal erythema.  Eyes:     General: No scleral icterus.       Right eye: No discharge.        Left eye: No discharge.     Extraocular Movements: Extraocular movements intact.     Conjunctiva/sclera: Conjunctivae normal.  Cardiovascular:     Rate and Rhythm: Normal rate and regular rhythm.     Pulses: Normal pulses.     Heart sounds: Normal heart sounds. No murmur heard.    No friction rub. No gallop.  Pulmonary:     Effort: Pulmonary effort is normal. No respiratory distress.     Breath sounds: Normal breath sounds. No stridor. No wheezing, rhonchi or rales.   Chest:     Chest wall: No tenderness.  Abdominal:     General: There is no distension.     Palpations: Abdomen is soft.     Tenderness: There is no abdominal tenderness. There is no right CVA tenderness, left CVA tenderness or guarding.  Musculoskeletal:        General: No swelling, tenderness, deformity or signs of injury.     Right lower leg: No edema.     Left lower leg: No edema.  Skin:    General: Skin is warm and dry.     Capillary Refill: Capillary refill takes less than 2  seconds.     Coloration: Skin is not jaundiced or pale.     Findings: No bruising, erythema or lesion.  Neurological:     Mental Status: He is alert and oriented to person, place, and time.     Motor: No weakness.     Gait: Gait normal.  Psychiatric:        Mood and Affect: Mood normal.        Behavior: Behavior normal.        Thought Content: Thought content normal.        Judgment: Judgment normal.     BP 136/67 (BP Location: Left Arm, Patient Position: Sitting, Cuff Size: Large)   Pulse 64   Wt 200 lb 6.4 oz (90.9 kg)   SpO2 99%   BMI 32.35 kg/m  Wt Readings from Last 3 Encounters:  03/10/24 200 lb 6.4 oz (90.9 kg)  03/04/24 205 lb (93 kg)  02/11/24 202 lb (91.6 kg)    Lab Results  Component Value Date   TSH 0.696 01/28/2024   Lab Results  Component Value Date   WBC 11.2 (H) 03/04/2024   HGB 18.4 (H) 03/04/2024   HCT 53.3 (H) 03/04/2024   MCV 89.9 03/04/2024   PLT 188 03/04/2024   Lab Results  Component Value Date   NA 139 03/04/2024   K 3.8 03/04/2024   CO2 23 03/04/2024   GLUCOSE 179 (H) 03/04/2024   BUN 7 03/04/2024   CREATININE 0.85 03/04/2024   BILITOT 0.5 01/28/2024   ALKPHOS 81 01/28/2024   AST 35 01/28/2024   ALT 52 (H) 01/28/2024   PROT 7.5 01/28/2024   ALBUMIN 4.7 01/28/2024   CALCIUM 9.1 03/04/2024   ANIONGAP 13 03/04/2024   EGFR 98 01/28/2024   Lab Results  Component Value Date   CHOL 181 01/28/2024   Lab Results  Component Value Date   HDL 45  01/28/2024   Lab Results  Component Value Date   LDLCALC 120 (H) 01/28/2024   Lab Results  Component Value Date   TRIG 88 01/28/2024   Lab Results  Component Value Date   CHOLHDL 4.0 01/28/2024   Lab Results  Component Value Date   HGBA1C 6.0 (A) 03/10/2024      Assessment & Plan:   Problem List Items Addressed This Visit       Cardiovascular and Mediastinum   Hypertension   BP Readings from Last 3 Encounters:  03/10/24 136/67  03/04/24 (!) 161/86  02/11/24 (!) 167/99   HTN Controlled .  On amlodipine  5 mg daily Continue current medications. No changes in management. Discussed DASH diet and dietary sodium restrictions Continue to increase dietary efforts and exercise.         Relevant Medications   amLODipine  (NORVASC ) 5 MG tablet     Respiratory   Asthma - Primary   Essential hypertension Blood pressure controlled at 136/67 mmHg with amlodipine . Emphasized medication adherence to prevent stroke and myocardial infarction. - Continue amlodipine  5 mg daily. - Sent refill for amlodipine  to pharmacy. - Advised to pick up medication before running out.  Intermittent asthma exacerbated by environmental triggers. Discussed Symbicort  for control and Singulair  for allergies. Encouraged trigger avoidance and mask use. - Prescribed Symbicort  inhaler, two puffs into both lungs twice daily.  Advised to rinse mouth after using the inhaler - Prescribed Singulair  10 mg at bedtime. - Sent refills for albuterol  inhaler. - Advised to avoid asthma triggers and use a mask when necessary. Continue prednisone  ordered at  the emergency department       Relevant Medications   albuterol  (VENTOLIN  HFA) 108 (90 Base) MCG/ACT inhaler   budesonide -formoterol  (SYMBICORT ) 80-4.5 MCG/ACT inhaler   montelukast  (SINGULAIR ) 10 MG tablet   Allergic rhinitis   Allergic rhinitis Symptoms of congestion and runny nose indicate allergic rhinitis. - Prescribed Singulair  10 mg at bedtime. Avoid  allergens        Relevant Medications   montelukast  (SINGULAIR ) 10 MG tablet     Other   Marijuana use   Need to quit smoking marijuana discussed      Prediabetes   Relevant Orders   HgB A1c (Completed)   Health care maintenance    Received flu vaccine. Discussed pneumonia and tetanus vaccines. - Advised to get pneumonia vaccine, Tdap vaccine and HPV vaccine at pharmacy.        Meds ordered this encounter  Medications   amLODipine  (NORVASC ) 5 MG tablet    Sig: Take 1 tablet (5 mg total) by mouth daily.    Dispense:  90 tablet    Refill:  1   albuterol  (VENTOLIN  HFA) 108 (90 Base) MCG/ACT inhaler    Sig: Inhale 2 puffs into the lungs every 6 (six) hours as needed for wheezing or shortness of breath.    Dispense:  18 g    Refill:  3   budesonide -formoterol  (SYMBICORT ) 80-4.5 MCG/ACT inhaler    Sig: Inhale 2 puffs into the lungs 2 (two) times daily.    Dispense:  1 each    Refill:  3   montelukast  (SINGULAIR ) 10 MG tablet    Sig: Take 1 tablet (10 mg total) by mouth at bedtime.    Dispense:  30 tablet    Refill:  3    Follow-up: Return in about 3 months (around 06/08/2024) for HTN  asthma .    Garvey Westcott R Venita Seng, FNP     [1] No Known Allergies

## 2024-03-10 NOTE — Patient Instructions (Signed)
 1. Essential hypertension - amLODipine  (NORVASC ) 5 MG tablet; Take 1 tablet (5 mg total) by mouth daily.  Dispense: 90 tablet; Refill: 1  2. Mild intermittent asthma, unspecified whether complicated (Primary) - albuterol  (VENTOLIN  HFA) 108 (90 Base) MCG/ACT inhaler; Inhale 2 puffs into the lungs every 6 (six) hours as needed for wheezing or shortness of breath.  Dispense: 18 g; Refill: 3 - budesonide -formoterol  (SYMBICORT ) 80-4.5 MCG/ACT inhaler; Inhale 2 puffs into the lungs 2 (two) times daily.  Dispense: 1 each; Refill: 3  3. Allergic rhinitis, unspecified seasonality, unspecified trigger - montelukast  (SINGULAIR ) 10 MG tablet; Take 1 tablet (10 mg total) by mouth at bedtime.  Dispense: 30 tablet; Refill: 3    It is important that you exercise regularly at least 30 minutes 5 times a week as tolerated  Think about what you will eat, plan ahead. Choose  clean, green, fresh or frozen over canned, processed or packaged foods which are more sugary, salty and fatty. 70 to 75% of food eaten should be vegetables and fruit. Three meals at set times with snacks allowed between meals, but they must be fruit or vegetables. Aim to eat over a 12 hour period , example 7 am to 7 pm, and STOP after  your last meal of the day. Drink water,generally about 64 ounces per day, no other drink is as healthy. Fruit juice is best enjoyed in a healthy way, by EATING the fruit.  Thanks for choosing [facility] we consider it a privelige to serve you.

## 2024-03-10 NOTE — Assessment & Plan Note (Signed)
°  Received flu vaccine. Discussed pneumonia and tetanus vaccines. - Advised to get pneumonia vaccine, Tdap vaccine and HPV vaccine at pharmacy.

## 2024-04-06 ENCOUNTER — Ambulatory Visit: Payer: Self-pay | Admitting: Nurse Practitioner

## 2024-04-24 ENCOUNTER — Ambulatory Visit (INDEPENDENT_AMBULATORY_CARE_PROVIDER_SITE_OTHER): Admitting: Nurse Practitioner

## 2024-04-24 ENCOUNTER — Encounter: Payer: Self-pay | Admitting: Nurse Practitioner

## 2024-04-24 VITALS — BP 115/66 | HR 84 | Wt 200.0 lb

## 2024-04-24 DIAGNOSIS — J309 Allergic rhinitis, unspecified: Secondary | ICD-10-CM | POA: Diagnosis not present

## 2024-04-24 DIAGNOSIS — K4091 Unilateral inguinal hernia, without obstruction or gangrene, recurrent: Secondary | ICD-10-CM | POA: Insufficient documentation

## 2024-04-24 DIAGNOSIS — R1909 Other intra-abdominal and pelvic swelling, mass and lump: Secondary | ICD-10-CM | POA: Insufficient documentation

## 2024-04-24 DIAGNOSIS — F17200 Nicotine dependence, unspecified, uncomplicated: Secondary | ICD-10-CM | POA: Diagnosis not present

## 2024-04-24 DIAGNOSIS — R21 Rash and other nonspecific skin eruption: Secondary | ICD-10-CM

## 2024-04-24 DIAGNOSIS — J4540 Moderate persistent asthma, uncomplicated: Secondary | ICD-10-CM

## 2024-04-24 DIAGNOSIS — I10 Essential (primary) hypertension: Secondary | ICD-10-CM

## 2024-04-24 MED ORDER — TRIAMCINOLONE ACETONIDE 0.1 % EX CREA: 1.0000 | TOPICAL_CREAM | Freq: Two times a day (BID) | CUTANEOUS | 0 refills | Status: AC

## 2024-04-24 MED ORDER — BUDESONIDE-FORMOTEROL FUMARATE 80-4.5 MCG/ACT IN AERO: 2.0000 | INHALATION_SPRAY | Freq: Two times a day (BID) | RESPIRATORY_TRACT | 3 refills | Status: AC

## 2024-04-24 MED ORDER — MONTELUKAST SODIUM 10 MG PO TABS: 10.0000 mg | ORAL_TABLET | Freq: Every day | ORAL | 1 refills | Status: AC

## 2024-04-24 NOTE — Patient Instructions (Signed)
 1. Tobacco use disorder, mild, abuse (Primary)  2. Primary hypertension  3. Allergic rhinitis, unspecified seasonality, unspecified trigger - montelukast  (SINGULAIR ) 10 MG tablet; Take 1 tablet (10 mg total) by mouth at bedtime.  Dispense: 90 tablet; Refill: 1  4. Unilateral recurrent inguinal hernia without obstruction or gangrene - US  Scrotum; Future  5. Dermatitis - triamcinolone  cream (KENALOG ) 0.1 %; Apply topically 2 (two) times daily.  Dispense: 80 g; Refill: 0  6. Mild intermittent asthma, unspecified whether complicated - budesonide -formoterol  (SYMBICORT ) 80-4.5 MCG/ACT inhaler; Inhale 2 puffs into the lungs 2 (two) times daily.  Dispense: 1 each; Refill: 3     It is important that you exercise regularly at least 30 minutes 5 times a week as tolerated  Think about what you will eat, plan ahead. Choose  clean, green, fresh or frozen over canned, processed or packaged foods which are more sugary, salty and fatty. 70 to 75% of food eaten should be vegetables and fruit. Three meals at set times with snacks allowed between meals, but they must be fruit or vegetables. Aim to eat over a 12 hour period , example 7 am to 7 pm, and STOP after  your last meal of the day. Drink water,generally about 64 ounces per day, no other drink is as healthy. Fruit juice is best enjoyed in a healthy way, by EATING the fruit.  Thanks for choosing [facility] we consider it a privelige to serve you.

## 2024-04-24 NOTE — Assessment & Plan Note (Addendum)
 A nodule noted on left buttocks  No infection or pus present. - Use triamcinolone  cream for itching. - Follow up with dermatologist. - Report pus or pain for possible antibiotics.

## 2024-04-24 NOTE — Assessment & Plan Note (Addendum)
" °  Asthma poorly controlled with daily albuterol  use and history of ER visits for asthma attacks. Non-adherence to Symbicort  and Singulair . - Start Symbicort , 2 puffs twice daily. - Start Singulair  10 mg at bedtime. - Educated on Symbicort  for asthma control.  "

## 2024-04-24 NOTE — Assessment & Plan Note (Signed)
 BP Readings from Last 3 Encounters:  04/24/24 115/66  03/10/24 136/67  03/04/24 (!) 161/86   HTN Controlled on amlodipine  5mg   Continue current medications. No changes in management. Discussed DASH diet and dietary sodium restrictions Continue to increase dietary efforts and exercise.

## 2024-04-24 NOTE — Assessment & Plan Note (Signed)
 Suspected inguinal hernia Swelling and occasional pain in the inguinal region suggestive of hernia. No acute symptoms present. - Order ultrasound for hernia evaluation. - Seek emergency care if symptoms worsen.

## 2024-04-30 ENCOUNTER — Ambulatory Visit (HOSPITAL_BASED_OUTPATIENT_CLINIC_OR_DEPARTMENT_OTHER)

## 2024-05-08 ENCOUNTER — Ambulatory Visit (HOSPITAL_BASED_OUTPATIENT_CLINIC_OR_DEPARTMENT_OTHER)

## 2024-06-08 ENCOUNTER — Ambulatory Visit: Payer: Self-pay | Admitting: Nurse Practitioner

## 2024-08-21 ENCOUNTER — Ambulatory Visit: Payer: Self-pay | Admitting: Nurse Practitioner

## 2024-09-08 ENCOUNTER — Ambulatory Visit: Payer: Self-pay | Admitting: Dermatology
